# Patient Record
Sex: Male | Born: 1965 | Race: Black or African American | Hispanic: No | Marital: Single | Smoking: Never smoker
Health system: Southern US, Community
[De-identification: ages and names within clinical notes are randomized; demographics above are authoritative.]

## PROBLEM LIST (undated history)

## (undated) DIAGNOSIS — N400 Enlarged prostate without lower urinary tract symptoms: Secondary | ICD-10-CM

## (undated) DIAGNOSIS — G4730 Sleep apnea, unspecified: Secondary | ICD-10-CM

## (undated) DIAGNOSIS — I10 Essential (primary) hypertension: Secondary | ICD-10-CM

## (undated) DIAGNOSIS — E1169 Type 2 diabetes mellitus with other specified complication: Secondary | ICD-10-CM

## (undated) DIAGNOSIS — E119 Type 2 diabetes mellitus without complications: Secondary | ICD-10-CM

## (undated) HISTORY — PX: ROTATOR CUFF REPAIR: SHX139

## (undated) HISTORY — DX: Type 2 diabetes mellitus without complications: E11.9

## (undated) HISTORY — DX: Benign prostatic hyperplasia without lower urinary tract symptoms: N40.0

## (undated) HISTORY — DX: Sleep apnea, unspecified: G47.30

## (undated) HISTORY — PX: OTHER SURGICAL HISTORY: SHX169

## (undated) HISTORY — DX: Type 2 diabetes mellitus with other specified complication: E11.69

## (undated) HISTORY — PX: BACK SURGERY: SHX140

## (undated) HISTORY — DX: Essential (primary) hypertension: I10

---

## 2015-01-28 ENCOUNTER — Other Ambulatory Visit: Payer: Self-pay | Admitting: Otolaryngology

## 2015-01-28 DIAGNOSIS — E041 Nontoxic single thyroid nodule: Secondary | ICD-10-CM

## 2015-02-03 ENCOUNTER — Other Ambulatory Visit: Payer: Self-pay | Admitting: Otolaryngology

## 2015-02-03 DIAGNOSIS — E041 Nontoxic single thyroid nodule: Secondary | ICD-10-CM

## 2015-02-10 ENCOUNTER — Ambulatory Visit
Admission: RE | Admit: 2015-02-10 | Discharge: 2015-02-10 | Disposition: A | Discharge status: Home / Self Care | Payer: 59 | Source: Ambulatory Visit | Attending: Otolaryngology | Admitting: Otolaryngology

## 2015-02-10 DIAGNOSIS — E041 Nontoxic single thyroid nodule: Secondary | ICD-10-CM

## 2015-03-07 ENCOUNTER — Other Ambulatory Visit: Payer: Self-pay | Admitting: Otolaryngology

## 2015-03-07 DIAGNOSIS — E041 Nontoxic single thyroid nodule: Secondary | ICD-10-CM

## 2015-03-09 ENCOUNTER — Other Ambulatory Visit (HOSPITAL_COMMUNITY)
Admission: RE | Admit: 2015-03-09 | Discharge: 2015-03-09 | Disposition: A | Discharge status: Home / Self Care | Payer: 59 | Source: Ambulatory Visit | Attending: Interventional Radiology | Admitting: Interventional Radiology

## 2015-03-09 ENCOUNTER — Ambulatory Visit
Admission: RE | Admit: 2015-03-09 | Discharge: 2015-03-09 | Disposition: A | Discharge status: Home / Self Care | Payer: 59 | Source: Ambulatory Visit | Attending: Otolaryngology | Admitting: Otolaryngology

## 2015-03-09 DIAGNOSIS — E041 Nontoxic single thyroid nodule: Secondary | ICD-10-CM | POA: Insufficient documentation

## 2015-08-16 ENCOUNTER — Other Ambulatory Visit: Payer: Self-pay | Admitting: Otolaryngology

## 2015-12-07 ENCOUNTER — Other Ambulatory Visit: Payer: Self-pay | Admitting: Otolaryngology

## 2015-12-07 DIAGNOSIS — E041 Nontoxic single thyroid nodule: Secondary | ICD-10-CM

## 2015-12-13 ENCOUNTER — Other Ambulatory Visit: Payer: Self-pay | Admitting: Otolaryngology

## 2015-12-13 DIAGNOSIS — E041 Nontoxic single thyroid nodule: Secondary | ICD-10-CM

## 2016-04-20 ENCOUNTER — Ambulatory Visit
Admission: RE | Admit: 2016-04-20 | Discharge: 2016-04-20 | Disposition: A | Discharge status: Home / Self Care | Payer: 59 | Source: Ambulatory Visit | Attending: Otolaryngology | Admitting: Otolaryngology

## 2016-04-20 DIAGNOSIS — E041 Nontoxic single thyroid nodule: Secondary | ICD-10-CM

## 2016-09-17 DIAGNOSIS — E782 Mixed hyperlipidemia: Secondary | ICD-10-CM | POA: Diagnosis not present

## 2016-09-17 DIAGNOSIS — I10 Essential (primary) hypertension: Secondary | ICD-10-CM | POA: Diagnosis not present

## 2016-09-17 DIAGNOSIS — E1165 Type 2 diabetes mellitus with hyperglycemia: Secondary | ICD-10-CM | POA: Diagnosis not present

## 2016-09-19 DIAGNOSIS — I10 Essential (primary) hypertension: Secondary | ICD-10-CM | POA: Diagnosis not present

## 2016-09-19 DIAGNOSIS — E1165 Type 2 diabetes mellitus with hyperglycemia: Secondary | ICD-10-CM | POA: Diagnosis not present

## 2016-09-19 DIAGNOSIS — E782 Mixed hyperlipidemia: Secondary | ICD-10-CM | POA: Diagnosis not present

## 2017-01-08 DIAGNOSIS — Z0001 Encounter for general adult medical examination with abnormal findings: Secondary | ICD-10-CM | POA: Diagnosis not present

## 2017-01-10 DIAGNOSIS — Z0001 Encounter for general adult medical examination with abnormal findings: Secondary | ICD-10-CM | POA: Diagnosis not present

## 2017-03-19 DIAGNOSIS — M545 Low back pain: Secondary | ICD-10-CM | POA: Diagnosis not present

## 2017-03-19 DIAGNOSIS — I10 Essential (primary) hypertension: Secondary | ICD-10-CM | POA: Diagnosis not present

## 2017-03-19 DIAGNOSIS — E1165 Type 2 diabetes mellitus with hyperglycemia: Secondary | ICD-10-CM | POA: Diagnosis not present

## 2017-03-25 DIAGNOSIS — M545 Low back pain: Secondary | ICD-10-CM | POA: Diagnosis not present

## 2017-04-05 IMAGING — US US THYROID
1 series · 12 of 25 positions shown · non-contrast
Comparison: 03/09/2015 and previous

CLINICAL DATA: Nodules. Previous FNA biopsy of 1 calcified isthmic
and 2 left nodules 03/09/2015

EXAM:
THYROID ULTRASOUND
TECHNIQUE: Ultrasound examination of the thyroid gland and adjacent soft
tissues was performed.

[Series 1: us thyroid · 0.09mm/px · 12 of 62 slices shown]
[im 3/62]
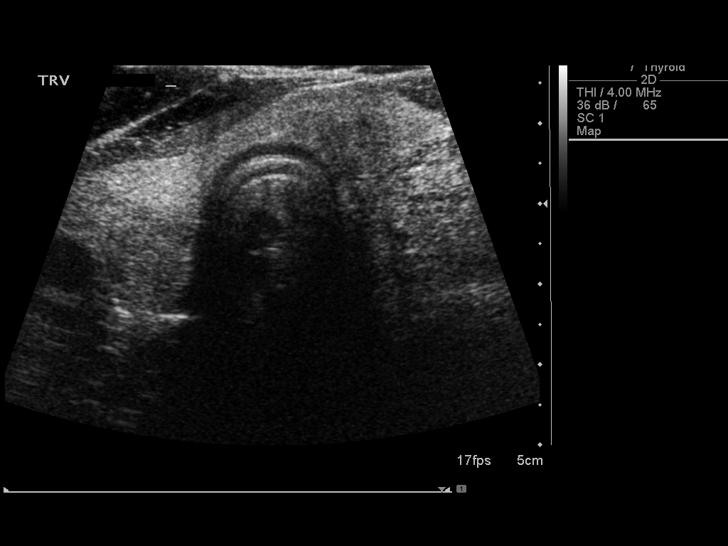
[im 8/62]
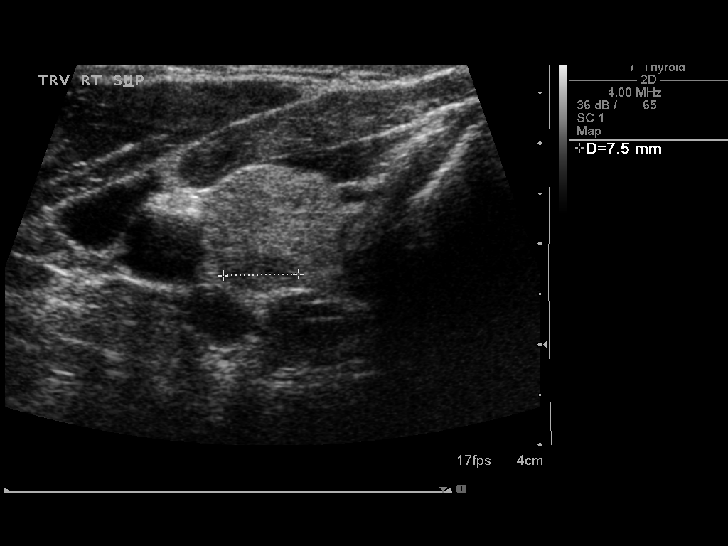
[im 13/62]
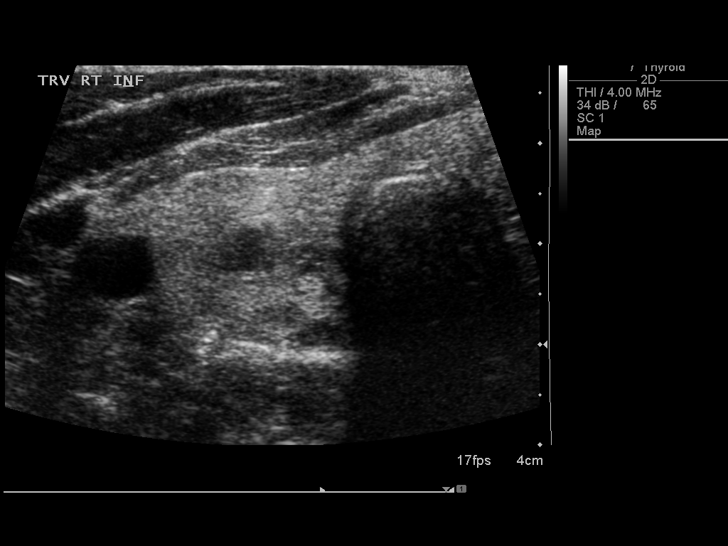
[im 18/62]
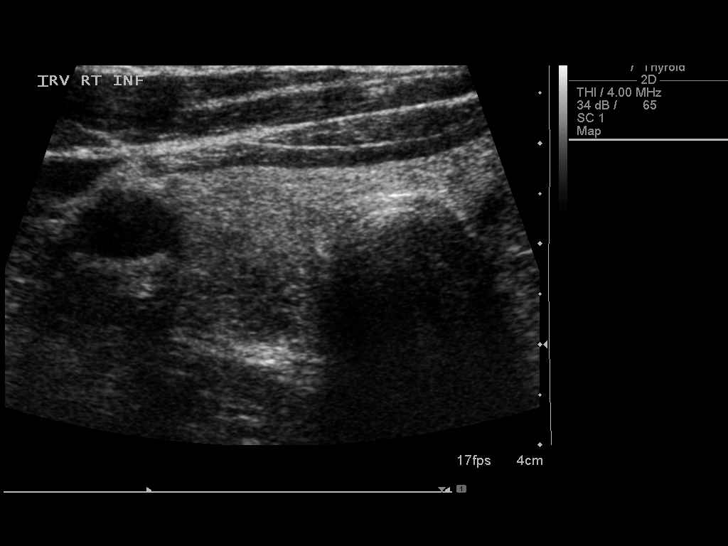
[im 23/62]
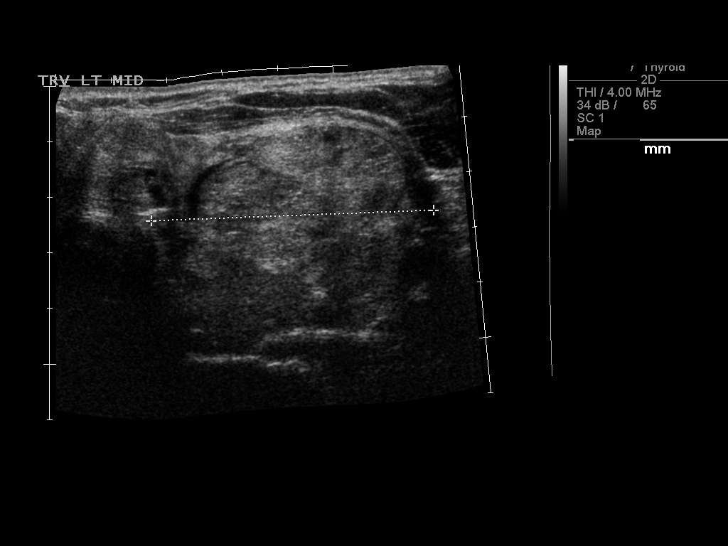
[im 28/62]
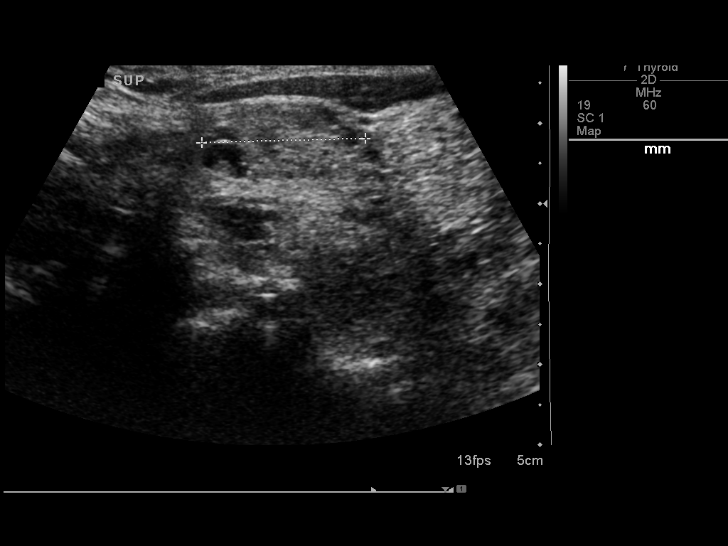
[im 34/62]
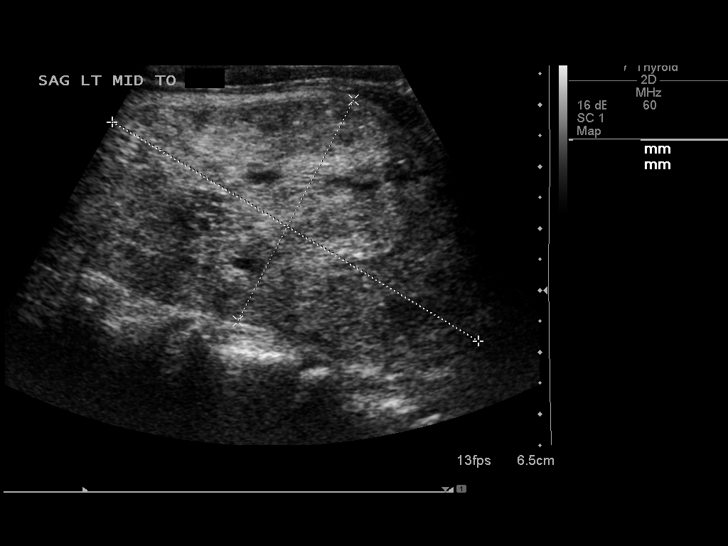
[im 39/62]
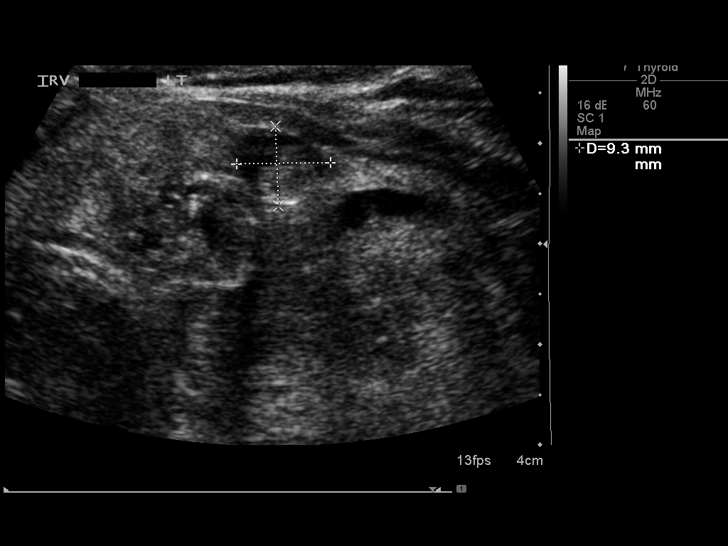
[im 44/62]
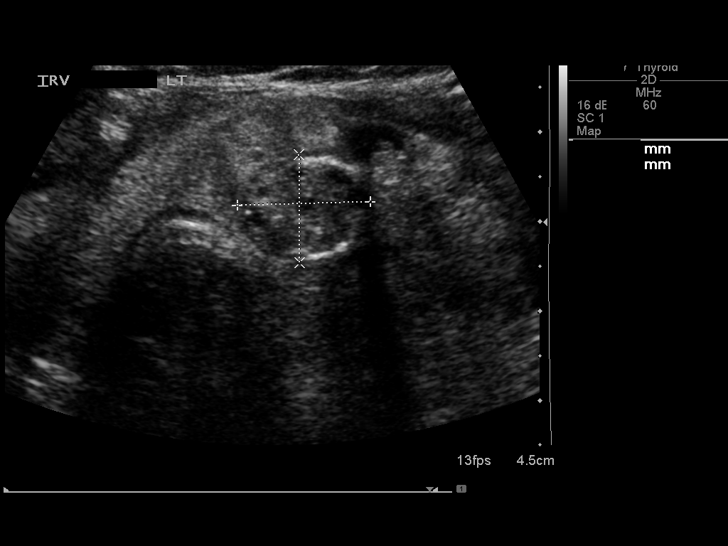
[im 49/62]
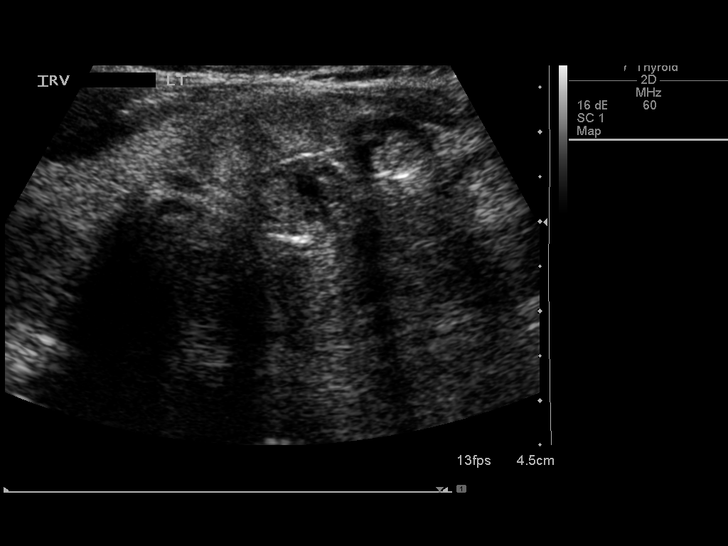
[im 54/62]
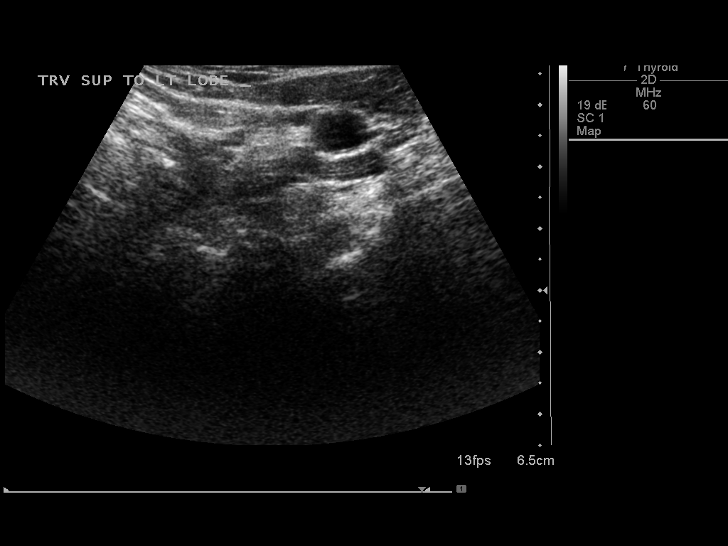
[im 59/62]
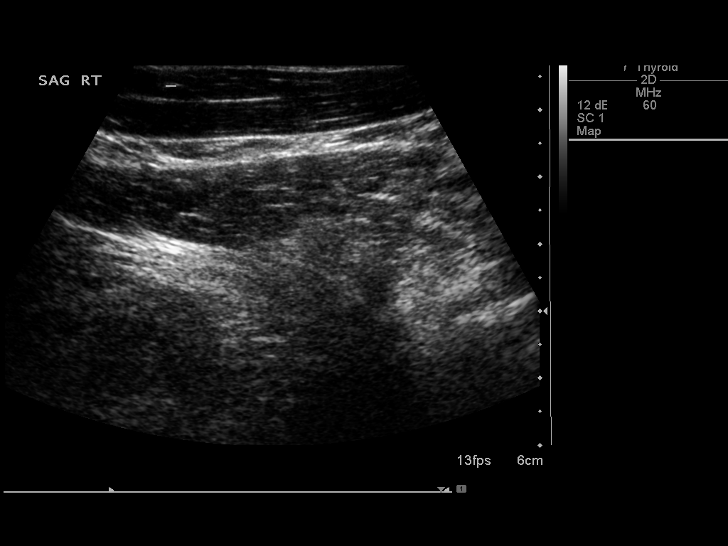

[12 of 25 positions shown; findings below may reference images not displayed]

FINDINGS: Parenchymal Echotexture: Mildly heterogenous

Estimated total number of nodules >/= 1 cm: 5

Number of spongiform nodules > 2 cm not described below (TR1): 0

Number of mixed cystic and solid nodules > 1.5 cm not described
below (TR2): 0

_________________________________________________________

Isthmus: 5 mm in thickness

Nodule # 1:

Location: Isthmus; left

Size: 1.1 x 0.8 x 0.9 cm (previously 9 mm)

Composition: mixed cystic and solid (1)

Echogenicity: isoechoic (1)

Shape: not taller-than-wide (0)

Margins: smooth (0)

Echogenic foci: peripheral calcifications (2)

ACR TI-RADS total points: 4.

ACR TI-RADS risk category: TR4 (4-6 points).

ACR TI-RADS recommendations:

*Given size (1 - 1.5 cm) and appearance, a follow-up ultrasound in 1
year should be considered based on TI-RADS criteria.

Nodule # 2:

Location: Isthmus; left inferior

Size: 1.7 x 1.2 x 1.5 cm (previously 1.4 x 1 x 1.3)

Composition: solid/almost completely solid (2)

Echogenicity: isoechoic (1)

Shape: not taller-than-wide (0)

Margins: smooth (0)

Echogenic foci: peripheral calcifications (2)

ACR TI-RADS total points: 5.

ACR TI-RADS risk category: TR4 (4-6 points).

ACR TI-RADS recommendations:

Previously biopsied. Assuming a benign diagnosis, repeat biopsy or
dedicated follow-up is NOT recommended based on TI-RADS criteria.

_________________________________________________________

Right lobe: 5.9 x 1.9 x 2 cm

Nodule # 1:

Location: Right; Inferior

Size: 1.8 x 1.3 x 1.7 cm (previously 1.3 x 0.8 x 1.1)

Composition: solid/almost completely solid (2)

Echogenicity: hypoechoic (2)

Shape: not taller-than-wide (0)

Margins: ill-defined (0)

Echogenic foci: none (0)

ACR TI-RADS total points: 4.

ACR TI-RADS risk category: TR4 (4-6 points).

ACR TI-RADS recommendations:

**Given size (>1.5 cm) and appearance, fine needle aspiration of
this moderately suspicious nodule should be considered based on
TI-RADS criteria.

_________________________________________________________

Left lobe: 8.8 x 4.8 x 5.1 cm

Nodule # 1:

Location: Left; Superior

Size: 2 x 1.3 x 1.4 cm (previously 1.9 x 1 x 1.4)

Composition: solid/almost completely solid (2)

Echogenicity: hyperechoic (1)

Shape: not taller-than-wide (0)

Margins: ill-defined (0)

Echogenic foci: none (0)

ACR TI-RADS total points: 3.

ACR TI-RADS risk category: TR3 (3 points).

ACR TI-RADS recommendations:

*Given size (1.5 - 2.4 cm) and appearance, a follow-up ultrasound in
1 year should be considered based on TI-RADS criteria.

Nodule # 2:

Location: Left; Inferior

Size: 6.9 x 3.7 x 4.4 cm (previously described as 2 separate
nodules, both regions biopsied)

Composition: solid/almost completely solid (2)

Echogenicity: hyperechoic (1)

Shape: not taller-than-wide (0)

Margins: ill-defined (0)

Echogenic foci: none (0)

ACR TI-RADS total points: 3.

ACR TI-RADS risk category: TR3 (3 points).

ACR TI-RADS recommendations:

Previously biopsied. Assuming a benign diagnosis, repeat biopsy or
dedicated follow-up is NOT recommended based on TI-RADS criteria.
IMPRESSION: 1. Thyromegaly with multiple nodules.
2. Moderately suspicious 18 mm right inferior nodule, FNA biopsy
recommended.
3. One year follow-up scan recommended of isthmic and superior left
nodules, as above.

The above is in keeping with the ACR TI-RADS recommendations - [HOSPITAL] 2940;[DATE].

## 2017-04-15 DIAGNOSIS — I10 Essential (primary) hypertension: Secondary | ICD-10-CM | POA: Diagnosis not present

## 2017-04-15 DIAGNOSIS — E1165 Type 2 diabetes mellitus with hyperglycemia: Secondary | ICD-10-CM | POA: Diagnosis not present

## 2017-04-15 DIAGNOSIS — D126 Benign neoplasm of colon, unspecified: Secondary | ICD-10-CM | POA: Diagnosis not present

## 2017-04-17 DIAGNOSIS — E1165 Type 2 diabetes mellitus with hyperglycemia: Secondary | ICD-10-CM | POA: Diagnosis not present

## 2017-04-17 DIAGNOSIS — E782 Mixed hyperlipidemia: Secondary | ICD-10-CM | POA: Diagnosis not present

## 2017-04-17 DIAGNOSIS — Z1389 Encounter for screening for other disorder: Secondary | ICD-10-CM | POA: Diagnosis not present

## 2017-04-17 DIAGNOSIS — I10 Essential (primary) hypertension: Secondary | ICD-10-CM | POA: Diagnosis not present

## 2017-06-04 DIAGNOSIS — E042 Nontoxic multinodular goiter: Secondary | ICD-10-CM | POA: Diagnosis not present

## 2017-06-12 DIAGNOSIS — Z23 Encounter for immunization: Secondary | ICD-10-CM | POA: Diagnosis not present

## 2017-06-20 ENCOUNTER — Other Ambulatory Visit: Payer: Self-pay | Admitting: Otolaryngology

## 2017-06-20 DIAGNOSIS — E042 Nontoxic multinodular goiter: Secondary | ICD-10-CM

## 2017-07-01 ENCOUNTER — Ambulatory Visit
Admission: RE | Admit: 2017-07-01 | Discharge: 2017-07-01 | Disposition: A | Discharge status: Home / Self Care | Payer: 59 | Source: Ambulatory Visit | Attending: Otolaryngology | Admitting: Otolaryngology

## 2017-07-01 DIAGNOSIS — E042 Nontoxic multinodular goiter: Secondary | ICD-10-CM | POA: Diagnosis not present

## 2017-07-09 DIAGNOSIS — I10 Essential (primary) hypertension: Secondary | ICD-10-CM | POA: Diagnosis not present

## 2017-07-09 DIAGNOSIS — E782 Mixed hyperlipidemia: Secondary | ICD-10-CM | POA: Diagnosis not present

## 2017-07-09 DIAGNOSIS — E1165 Type 2 diabetes mellitus with hyperglycemia: Secondary | ICD-10-CM | POA: Diagnosis not present

## 2017-07-17 DIAGNOSIS — E1165 Type 2 diabetes mellitus with hyperglycemia: Secondary | ICD-10-CM | POA: Diagnosis not present

## 2017-07-17 DIAGNOSIS — I10 Essential (primary) hypertension: Secondary | ICD-10-CM | POA: Diagnosis not present

## 2017-07-17 DIAGNOSIS — E782 Mixed hyperlipidemia: Secondary | ICD-10-CM | POA: Diagnosis not present

## 2017-08-09 DIAGNOSIS — G4733 Obstructive sleep apnea (adult) (pediatric): Secondary | ICD-10-CM | POA: Diagnosis not present

## 2017-08-31 DIAGNOSIS — Z719 Counseling, unspecified: Secondary | ICD-10-CM | POA: Diagnosis not present

## 2017-09-07 DIAGNOSIS — Z23 Encounter for immunization: Secondary | ICD-10-CM | POA: Diagnosis not present

## 2017-09-13 DIAGNOSIS — Z719 Counseling, unspecified: Secondary | ICD-10-CM | POA: Diagnosis not present

## 2017-09-20 DIAGNOSIS — Z719 Counseling, unspecified: Secondary | ICD-10-CM | POA: Diagnosis not present

## 2017-09-27 DIAGNOSIS — Z719 Counseling, unspecified: Secondary | ICD-10-CM | POA: Diagnosis not present

## 2017-10-04 DIAGNOSIS — Z719 Counseling, unspecified: Secondary | ICD-10-CM | POA: Diagnosis not present

## 2017-10-11 DIAGNOSIS — Z719 Counseling, unspecified: Secondary | ICD-10-CM | POA: Diagnosis not present

## 2017-10-18 DIAGNOSIS — Z719 Counseling, unspecified: Secondary | ICD-10-CM | POA: Diagnosis not present

## 2017-10-25 DIAGNOSIS — Z719 Counseling, unspecified: Secondary | ICD-10-CM | POA: Diagnosis not present

## 2017-11-01 DIAGNOSIS — Z719 Counseling, unspecified: Secondary | ICD-10-CM | POA: Diagnosis not present

## 2017-11-08 DIAGNOSIS — Z719 Counseling, unspecified: Secondary | ICD-10-CM | POA: Diagnosis not present

## 2017-11-15 DIAGNOSIS — Z719 Counseling, unspecified: Secondary | ICD-10-CM | POA: Diagnosis not present

## 2017-11-22 DIAGNOSIS — Z719 Counseling, unspecified: Secondary | ICD-10-CM | POA: Diagnosis not present

## 2017-11-26 DIAGNOSIS — Z0000 Encounter for general adult medical examination without abnormal findings: Secondary | ICD-10-CM | POA: Diagnosis not present

## 2017-11-29 DIAGNOSIS — Z719 Counseling, unspecified: Secondary | ICD-10-CM | POA: Diagnosis not present

## 2017-12-20 DIAGNOSIS — Z23 Encounter for immunization: Secondary | ICD-10-CM | POA: Diagnosis not present

## 2018-01-17 DIAGNOSIS — Z0001 Encounter for general adult medical examination with abnormal findings: Secondary | ICD-10-CM | POA: Diagnosis not present

## 2018-01-17 DIAGNOSIS — E782 Mixed hyperlipidemia: Secondary | ICD-10-CM | POA: Diagnosis not present

## 2018-01-17 DIAGNOSIS — I10 Essential (primary) hypertension: Secondary | ICD-10-CM | POA: Diagnosis not present

## 2018-01-17 DIAGNOSIS — E1165 Type 2 diabetes mellitus with hyperglycemia: Secondary | ICD-10-CM | POA: Diagnosis not present

## 2018-01-17 DIAGNOSIS — K219 Gastro-esophageal reflux disease without esophagitis: Secondary | ICD-10-CM | POA: Diagnosis not present

## 2018-01-20 DIAGNOSIS — E782 Mixed hyperlipidemia: Secondary | ICD-10-CM | POA: Diagnosis not present

## 2018-01-20 DIAGNOSIS — E119 Type 2 diabetes mellitus without complications: Secondary | ICD-10-CM | POA: Diagnosis not present

## 2018-01-20 DIAGNOSIS — I10 Essential (primary) hypertension: Secondary | ICD-10-CM | POA: Diagnosis not present

## 2018-01-24 DIAGNOSIS — R001 Bradycardia, unspecified: Secondary | ICD-10-CM | POA: Diagnosis not present

## 2018-02-06 ENCOUNTER — Ambulatory Visit (INDEPENDENT_AMBULATORY_CARE_PROVIDER_SITE_OTHER): Payer: 59 | Admitting: Cardiovascular Disease

## 2018-02-06 ENCOUNTER — Encounter: Payer: Self-pay | Admitting: Cardiovascular Disease

## 2018-02-06 VITALS — BP 98/60 | HR 65 | Ht 71.0 in | Wt 175.0 lb

## 2018-02-06 DIAGNOSIS — G4730 Sleep apnea, unspecified: Secondary | ICD-10-CM | POA: Diagnosis not present

## 2018-02-06 DIAGNOSIS — I10 Essential (primary) hypertension: Secondary | ICD-10-CM | POA: Diagnosis not present

## 2018-02-06 DIAGNOSIS — R001 Bradycardia, unspecified: Secondary | ICD-10-CM

## 2018-02-06 NOTE — Patient Instructions (Signed)
Your physician recommends that you schedule a follow-up appointment in: 3 months Dr Bronson Ing    Your physician has recommended that you wear an event monitor. Event monitors are medical devices that record the heart's electrical activity. Doctors most often [place] these monitors to diagnose arrhythmias. Arrhythmias are problems with the speed or rhythm of the heartbeat. The monitor is a small, portable device. You can wear one while you do your normal daily activities. This is usually used to diagnose what is causing palpitations/syncope (passing out).    Your physician recommends that you continue on your current medications as directed. Please refer to the Current Medication list given to you today.    If you need a refill on your cardiac medications before your next appointment, please call your pharmacy.     No lab work ordered today     Thank you for choosing [facility] !

## 2018-02-06 NOTE — Progress Notes (Signed)
CARDIOLOGY CONSULT NOTE  Patient ID: Kenneth Johnson MRN: [id number] DOB/AGE: [birth year] 52 y.o.  Admit date: (Not on file) Primary Physician: Curlene Labrum, MD Referring Physician: Curlene Labrum, MD  Reason for Consultation: Bradycardia  HPI: Kenneth Johnson is a 52 y.o. male who is being seen today for the evaluation of bradycardia at the request of Burdine, Virgina Evener, MD.   Past medical history includes hypertension, hyperlipidemia, and type 2 diabetes mellitus.  I reviewed labs dated 01/17/2018: BUN 15, creatinine 1.06, sodium 142, potassium 4.2, hemoglobin A1c 5.8%, total cholesterol 116, triglycerides 36, HDL 62, LDL 47, TSH 1.35.  I personally reviewed an outside ECG which demonstrated sinus bradycardia, 45 bpm, with nonspecific T wave abnormalities.  He is entirely asymptomatic and denies chest pain, shortness of breath, palpitations, leg swelling, lightheadedness, dizziness, and syncope.  He has sleep apnea but has not been using CPAP for the past month.  He recently lost 40 pounds through changing dietary habits and exercising.  His girlfriend is a physician and was concerned about his low heart rate which made him concerned.  Energy levels are stable.  With regard to exercise, he does some cardio including walking and jumping jacks.  He wears a FitBit and his heart rate averages in the low 50 bpm range to low 60 bpm range.  Family history: His father is 23 years old and has a slow heart rate and is being monitored for this.  He does not have a pacemaker.   No Known Allergies  Current Outpatient Medications  Medication Sig Dispense Refill  . acyclovir (ZOVIRAX) 400 MG tablet Take 400 mg by mouth daily.    Marland Kitchen lisinopril (PRINIVIL,ZESTRIL) 10 MG tablet Take 10 mg by mouth daily.    . metFORMIN (GLUCOPHAGE) 1000 MG tablet Take 1,000 mg by mouth 2 (two) times daily with a meal.    . rosuvastatin (CRESTOR) 5 MG tablet Take 5 mg by mouth daily.     No  current facility-administered medications for this visit.     Past Medical History:  Diagnosis Date  . DM (diabetes mellitus) ([place])     Past Surgical History:  Procedure Laterality Date  . gum implants      Social History   Socioeconomic History  . Marital status: Single    Spouse name: Not on file  . Number of children: Not on file  . Years of education: Not on file  . Highest education level: Not on file  Occupational History  . Not on file  Social Needs  . Financial resource strain: Not on file  . Food insecurity:    Worry: Not on file    Inability: Not on file  . Transportation needs:    Medical: Not on file    Non-medical: Not on file  Tobacco Use  . Smoking status: Never Smoker  . Smokeless tobacco: Never Used  Substance and Sexual Activity  . Alcohol use: Never    Frequency: Never  . Drug use: Never  . Sexual activity: Not on file  Lifestyle  . Physical activity:    Days per week: Not on file    Minutes per session: Not on file  . Stress: Not on file  Relationships  . Social connections:    Talks on phone: Not on file    Gets together: Not on file    Attends religious service: Not on file    Active member of club or organization: Not on file  Attends meetings of clubs or organizations: Not on file    Relationship status: Not on file  . Intimate partner violence:    Fear of current or ex partner: Not on file    Emotionally abused: Not on file    Physically abused: Not on file    Forced sexual activity: Not on file  Other Topics Concern  . Not on file  Social History Narrative  . Not on file     Current Meds  Medication Sig  . acyclovir (ZOVIRAX) 400 MG tablet Take 400 mg by mouth daily.  Marland Kitchen lisinopril (PRINIVIL,ZESTRIL) 10 MG tablet Take 10 mg by mouth daily.  . metFORMIN (GLUCOPHAGE) 1000 MG tablet Take 1,000 mg by mouth 2 (two) times daily with a meal.  . rosuvastatin (CRESTOR) 5 MG tablet Take 5 mg by mouth daily.      Review of  systems complete and found to be negative unless listed above in HPI    Physical exam Blood pressure 98/60, pulse 65, height 5\' 11"  (1.803 m), weight 175 lb (79.4 kg), SpO2 98 %. General: NAD Neck: No JVD, no thyromegaly or thyroid nodule.  Lungs: Clear to auscultation bilaterally with normal respiratory effort. CV: Nondisplaced PMI. Regular rate and rhythm, normal S1/S2, no S3/S4, no murmur.  No peripheral edema.  No carotid bruit.    Abdomen: Soft, nontender, no distention.  Skin: Intact without lesions or rashes.  Neurologic: Alert and oriented x 3.  Psych: Normal affect. Extremities: No clubbing or cyanosis.  HEENT: Normal.   ECG: Most recent ECG reviewed.   Labs: No results found for: K, BUN, CREATININE, ALT, TSH, HGB   Lipids: No results found for: LDLCALC, LDLDIRECT, CHOL, TRIG, HDL      ASSESSMENT AND PLAN:  1.  Bradycardia: TSH is normal.  He has sleep apnea but has not been using CPAP for the past month which can account for bradycardia.  He has also lost 40 pounds and become more fit which would physiologically reduce his heart rate.  He is entirely asymptomatic at this time.  I will obtain a 1 week event monitor to assess for significant bradycardia.  I suspect I will simply continue monitoring him for symptoms.  2.  Sleep apnea: He has not been using CPAP for the past month and I highly encouraged CPAP compliance and explained to him that untreated sleep apnea is a risk factor for arrhythmias, CVA, and MI.  3.  Hypertension: Controlled on present therapy.  No changes.   Disposition: Follow up in 3 months   Signed: Kate Sable, M.D., F.A.C.C.  02/06/2018, 2:13 PM

## 2018-02-10 ENCOUNTER — Ambulatory Visit (INDEPENDENT_AMBULATORY_CARE_PROVIDER_SITE_OTHER): Payer: 59

## 2018-02-10 DIAGNOSIS — R001 Bradycardia, unspecified: Secondary | ICD-10-CM | POA: Diagnosis not present

## 2018-02-10 DIAGNOSIS — M25511 Pain in right shoulder: Secondary | ICD-10-CM | POA: Diagnosis not present

## 2018-02-10 DIAGNOSIS — Z6824 Body mass index (BMI) 24.0-24.9, adult: Secondary | ICD-10-CM | POA: Diagnosis not present

## 2018-03-20 ENCOUNTER — Other Ambulatory Visit: Payer: Self-pay | Admitting: Otolaryngology

## 2018-03-20 DIAGNOSIS — E042 Nontoxic multinodular goiter: Secondary | ICD-10-CM

## 2018-03-26 ENCOUNTER — Ambulatory Visit
Admission: RE | Admit: 2018-03-26 | Discharge: 2018-03-26 | Disposition: A | Discharge status: Home / Self Care | Payer: 59 | Source: Ambulatory Visit | Attending: Otolaryngology | Admitting: Otolaryngology

## 2018-03-26 DIAGNOSIS — E042 Nontoxic multinodular goiter: Secondary | ICD-10-CM

## 2018-03-26 DIAGNOSIS — E010 Iodine-deficiency related diffuse (endemic) goiter: Secondary | ICD-10-CM | POA: Diagnosis not present

## 2018-04-08 DIAGNOSIS — E042 Nontoxic multinodular goiter: Secondary | ICD-10-CM | POA: Diagnosis not present

## 2018-04-22 ENCOUNTER — Encounter: Payer: Self-pay | Admitting: Cardiovascular Disease

## 2018-05-20 ENCOUNTER — Ambulatory Visit: Payer: 59 | Admitting: Cardiovascular Disease

## 2018-05-28 DIAGNOSIS — Z23 Encounter for immunization: Secondary | ICD-10-CM | POA: Diagnosis not present

## 2018-07-14 DIAGNOSIS — E1165 Type 2 diabetes mellitus with hyperglycemia: Secondary | ICD-10-CM | POA: Diagnosis not present

## 2018-07-14 DIAGNOSIS — E782 Mixed hyperlipidemia: Secondary | ICD-10-CM | POA: Diagnosis not present

## 2018-07-14 DIAGNOSIS — M545 Low back pain: Secondary | ICD-10-CM | POA: Diagnosis not present

## 2018-07-14 DIAGNOSIS — I10 Essential (primary) hypertension: Secondary | ICD-10-CM | POA: Diagnosis not present

## 2018-07-22 DIAGNOSIS — E119 Type 2 diabetes mellitus without complications: Secondary | ICD-10-CM | POA: Diagnosis not present

## 2018-07-22 DIAGNOSIS — Z1389 Encounter for screening for other disorder: Secondary | ICD-10-CM | POA: Diagnosis not present

## 2018-07-22 DIAGNOSIS — I10 Essential (primary) hypertension: Secondary | ICD-10-CM | POA: Diagnosis not present

## 2018-11-07 DIAGNOSIS — E1165 Type 2 diabetes mellitus with hyperglycemia: Secondary | ICD-10-CM | POA: Diagnosis not present

## 2018-11-07 DIAGNOSIS — Z6826 Body mass index (BMI) 26.0-26.9, adult: Secondary | ICD-10-CM | POA: Diagnosis not present

## 2018-11-07 DIAGNOSIS — R6889 Other general symptoms and signs: Secondary | ICD-10-CM | POA: Diagnosis not present

## 2019-01-07 IMAGING — US US THYROID
1 series · 12 of 25 positions shown · non-contrast
Comparison: 02/10/2015, 03/09/2015, 04/20/2016

CLINICAL DATA: Goiter.  Multiple thyroid nodule

EXAM:
THYROID ULTRASOUND
TECHNIQUE: Ultrasound examination of the thyroid gland and adjacent soft
tissues was performed.

[Series 1: us thyroid · 0.04mm/px · 12 of 64 slices shown]
[im 3/64]
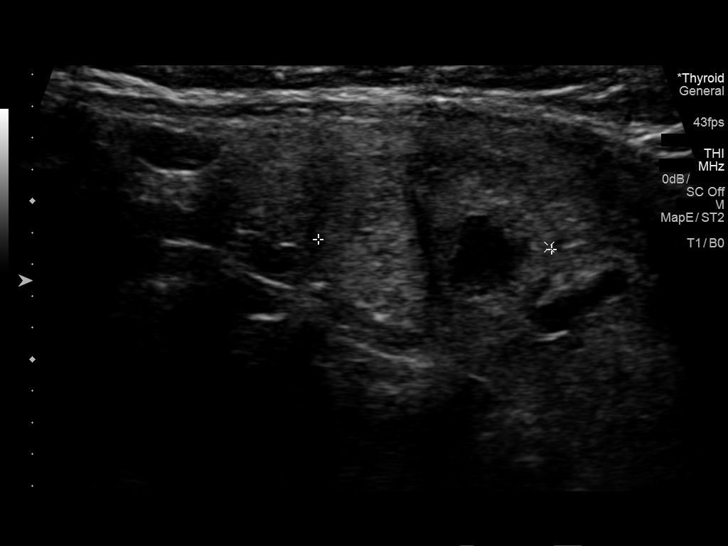
[im 8/64]
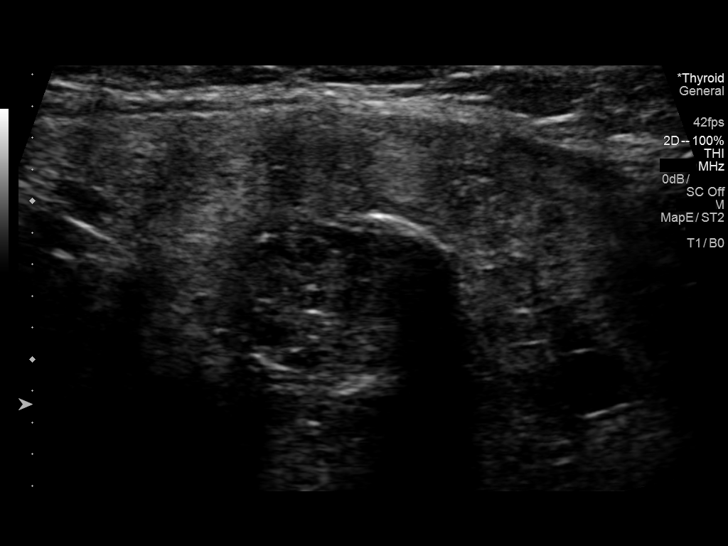
[im 14/64]
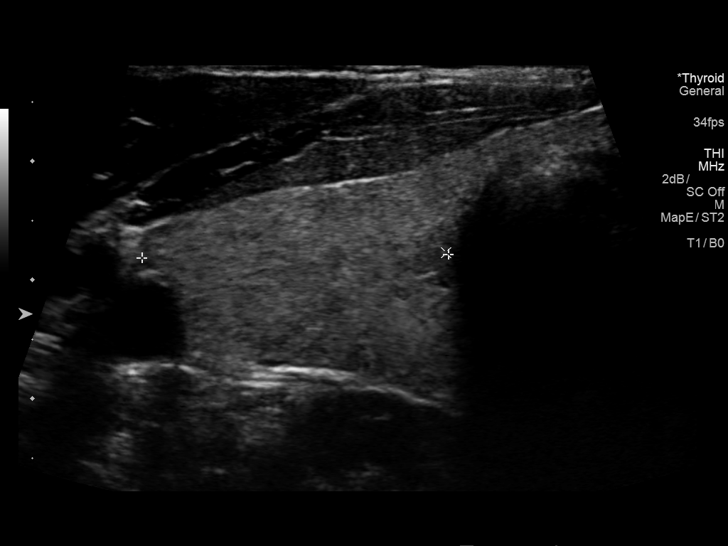
[im 19/64]
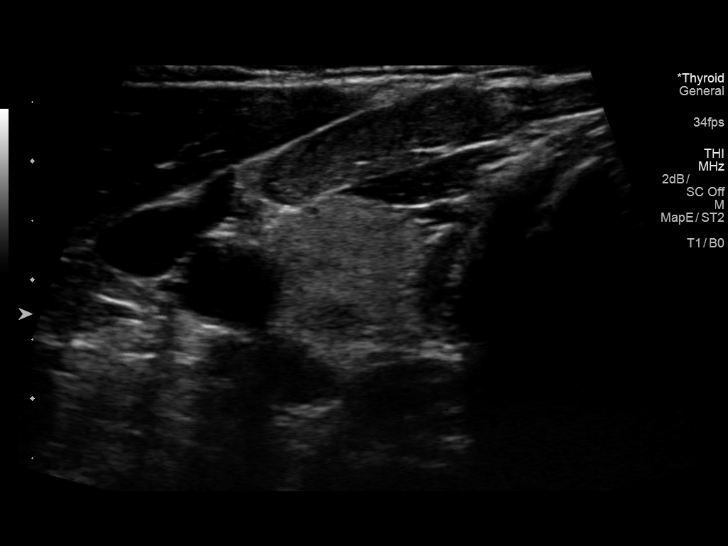
[im 24/64]
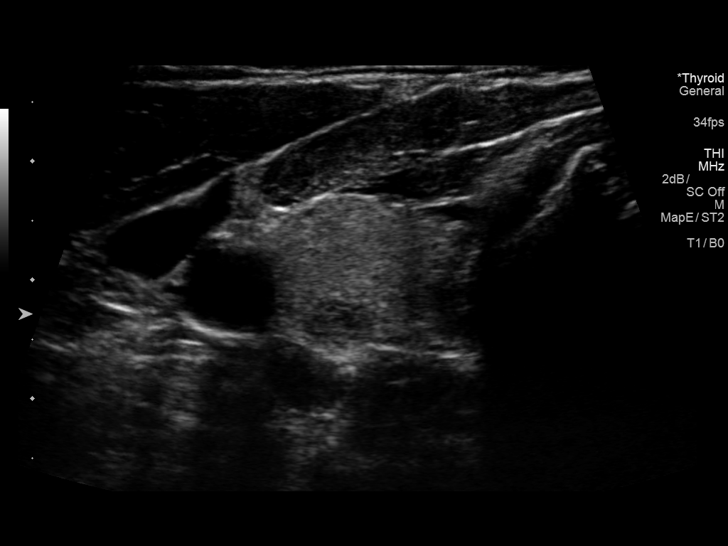
[im 29/64]
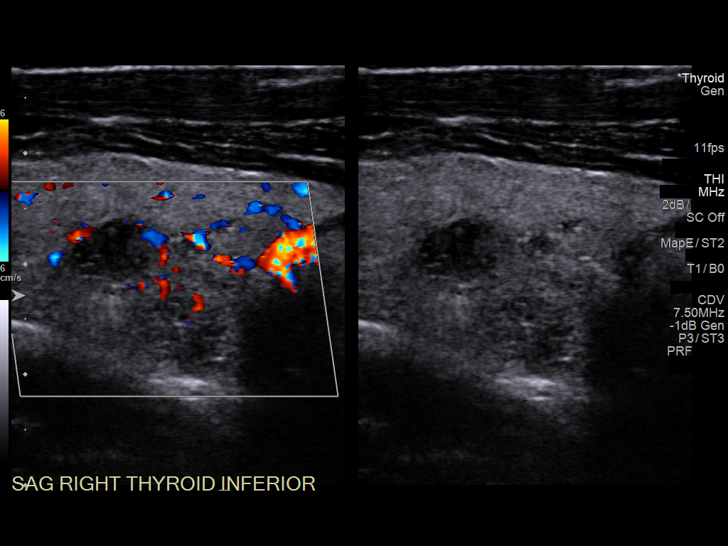
[im 35/64]
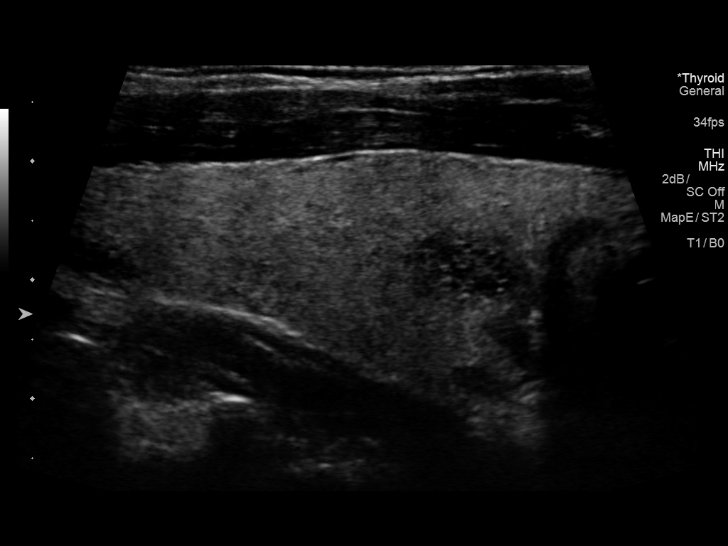
[im 40/64]
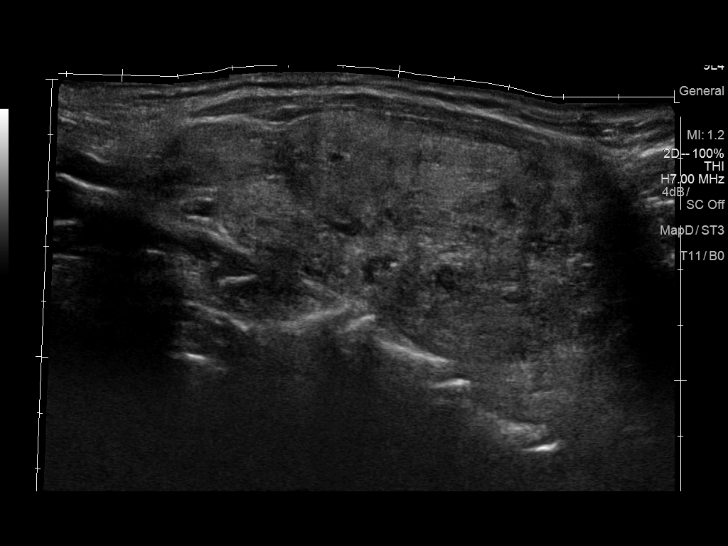
[im 45/64]
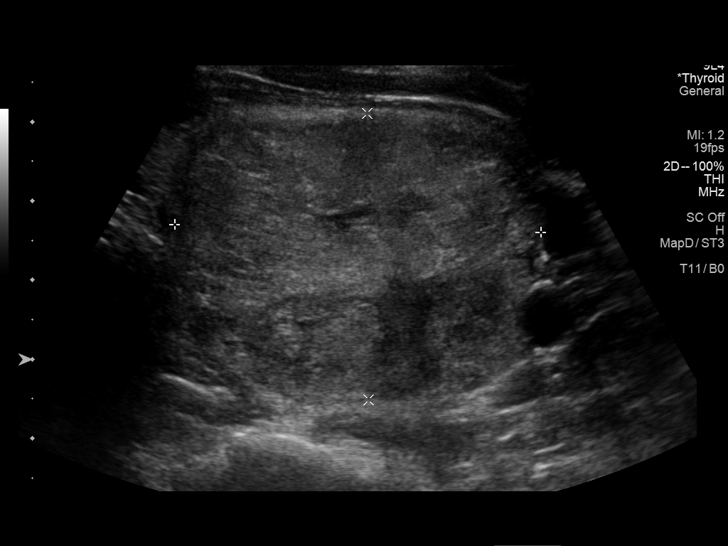
[im 50/64]
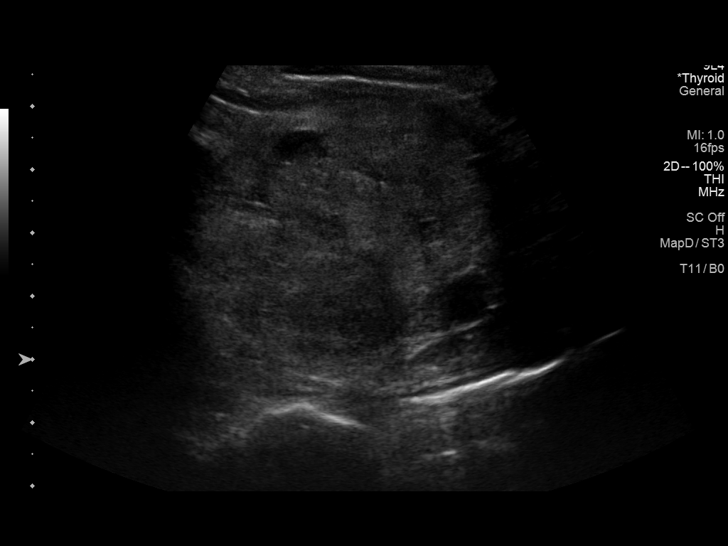
[im 56/64]
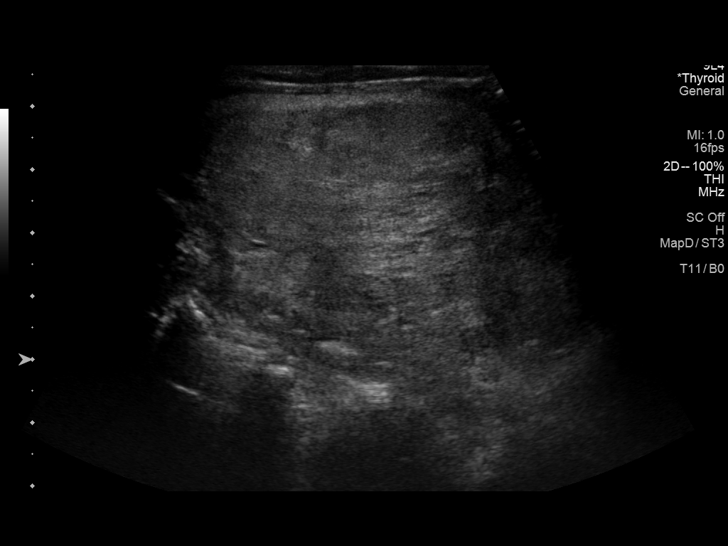
[im 61/64]
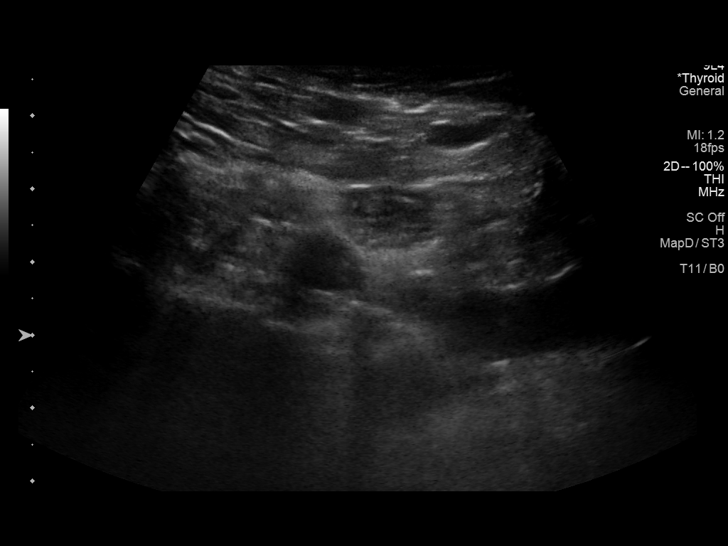

[12 of 25 positions shown; findings below may reference images not displayed]

FINDINGS: Parenchymal Echotexture: Moderately heterogenous

Isthmus: 5 mm, [place]

Right lobe: 6.1 x 1.8 x 2.6 cm, previously 5.9 x 1.9 x 2.0 cm

Left lobe: 10.0 x 4.2 x 5.0 cm, previously 8.8 x 4.8 x 5.1 cm

_________________________________________________________

Estimated total number of nodules >/= 1 cm: 4

Number of spongiform nodules >/=  2 cm not described below (TR1): 0

Number of mixed cystic and solid nodules >/= 1.5 cm not described
below (TR2): 0

_________________________________________________________

Nodule # 1:

Location: Isthmus; Mid

Maximum size: 1.6, previously 1.1 cm; Other 2 dimensions: 1.5 x 1.3,
previously 0.9 x 0.8 cm

Composition: mixed cystic and solid (1)

Echogenicity: isoechoic (1)

Shape: not taller-than-wide (0)

Margins: ill-defined (0)

Echogenic foci: none (0)

ACR TI-RADS total points: 2.

ACR TI-RADS risk category: TR2 (2 points).

ACR TI-RADS recommendations:

This nodule does NOT meet TI-RADS criteria for biopsy or dedicated
follow-up.

_________________________________________________________

Nodule # 2:

Prior biopsy: Yes

Location: Isthmus; Mid

Maximum size: 1.5 cm; Other 2 dimensions: 1.4 x 1.1 cm, previously,
1.5 x 1.3 cm

Composition: solid/almost completely solid (2)

Echogenicity: hypoechoic (2)

Shape: not taller-than-wide (0)

Margins: ill-defined (0)

Echogenic foci: peripheral calcifications (2)

ACR TI-RADS total points: 6.

ACR TI-RADS risk category:  TR4 (4-6 points).

Significant change in size (>/= 20% in two dimensions and minimal
increase of 2 mm): No

Change in features: No

Change in ACR TI-RADS risk category: No

ACR TI-RADS recommendations:

**Given size (>/= 1.5 cm) and appearance, fine needle aspiration of
this moderately suspicious nodule should be considered based on
TI-RADS criteria. Biopsy this nodule has already been performed.
Correlate with pathology.

_________________________________________________________

Nodule # 4:

Location: Right; Inferior

Maximum size: 2.1, previously 1.8 cm; Other 2 dimensions: 1.4 x 1.3,
previously 1.7 x 1.3 cm

Composition: solid/almost completely solid (2)

Echogenicity: hypoechoic (2)

Shape: not taller-than-wide (0)

Margins: ill-defined (0)

Echogenic foci: none (0)

ACR TI-RADS total points: 4.

ACR TI-RADS risk category: TR4 (4-6 points).

ACR TI-RADS recommendations:

**Given size (>/= 1.5 cm) and appearance, fine needle aspiration of
this moderately suspicious nodule should be considered based on
TI-RADS criteria.

_________________________________________________________

Nodule # 5:

Location: Left; Superior

Maximum size: 1.8, previously 2.0 cm; Other 2 dimensions: 1.6 x 1.6,
previously 1.4 x 1.3 cm

Composition: solid/almost completely solid (2)

Echogenicity: isoechoic (1)

Shape: not taller-than-wide (0)

Margins: ill-defined (0)

Echogenic foci: none (0)

ACR TI-RADS total points: 3.

ACR TI-RADS risk category: TR3 (3 points).

ACR TI-RADS recommendations:

*Given size (>/= 1.5 - 2.4 cm) and appearance, a follow-up
ultrasound in 1 year should be considered based on TI-RADS criteria.

_________________________________________________________

Nodule # 6:

Prior biopsy: Yes

Location: Left; Inferior

Maximum size: 7.2 cm; Other 2 dimensions: 4.6 x 3.6 cm, previously,
4.4 x 3.7 cm

Composition: solid/almost completely solid (2)

Echogenicity: isoechoic (1)

Shape: not taller-than-wide (0)

Margins: ill-defined (0)

Echogenic foci: none (0)

ACR TI-RADS total points: 3.

ACR TI-RADS risk category:  TR3 (3 points).

Significant change in size (>/= 20% in two dimensions and minimal
increase of 2 mm): No

Change in features: No

Change in ACR TI-RADS risk category: No

ACR TI-RADS recommendations:

**Given size (>/= 2.5 cm) and appearance, fine needle aspiration of
this mildly suspicious nodule should be considered based on TI-RADS
criteria. This nodule has already been biopsied correlate with prior
pathology

_________________________________________________________

No adenopathy
IMPRESSION: Grossly stable thyromegaly with multiple nodules.

2.1 cm right inferior TR 4 nodule meets criteria for biopsy as
above.

The above is in keeping with the ACR TI-RADS recommendations - [HOSPITAL] 3993;[DATE].

## 2019-03-07 ENCOUNTER — Other Ambulatory Visit: Payer: Self-pay | Admitting: Otolaryngology

## 2019-03-07 DIAGNOSIS — E042 Nontoxic multinodular goiter: Secondary | ICD-10-CM

## 2019-03-13 ENCOUNTER — Ambulatory Visit
Admission: RE | Admit: 2019-03-13 | Discharge: 2019-03-13 | Disposition: A | Discharge status: Home / Self Care | Payer: 59 | Source: Ambulatory Visit | Attending: Otolaryngology | Admitting: Otolaryngology

## 2019-03-13 DIAGNOSIS — E042 Nontoxic multinodular goiter: Secondary | ICD-10-CM

## 2019-09-12 ENCOUNTER — Ambulatory Visit: Payer: 59 | Attending: Internal Medicine

## 2019-09-12 DIAGNOSIS — Z23 Encounter for immunization: Secondary | ICD-10-CM

## 2019-10-02 IMAGING — US US THYROID
1 series · 13 of 25 positions shown · non-contrast
Comparison: None.

07/01/2017 and previous back to 02/10/2015

CLINICAL DATA: Multinodular goiter. Previous FNA biopsy of anterior
left, posterior left, and calcified isthmic nodules 03/09/2015

EXAM:
THYROID ULTRASOUND
TECHNIQUE: Ultrasound examination of the thyroid gland and adjacent soft
tissues was performed.

[Series 1: us thyroid · 0.04mm/px · 13 of 89 slices shown]
[im 1/89]
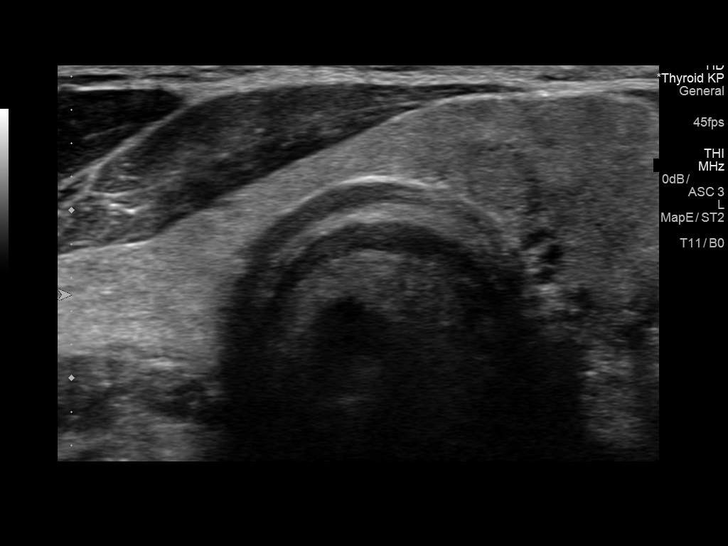
[im 8/89]
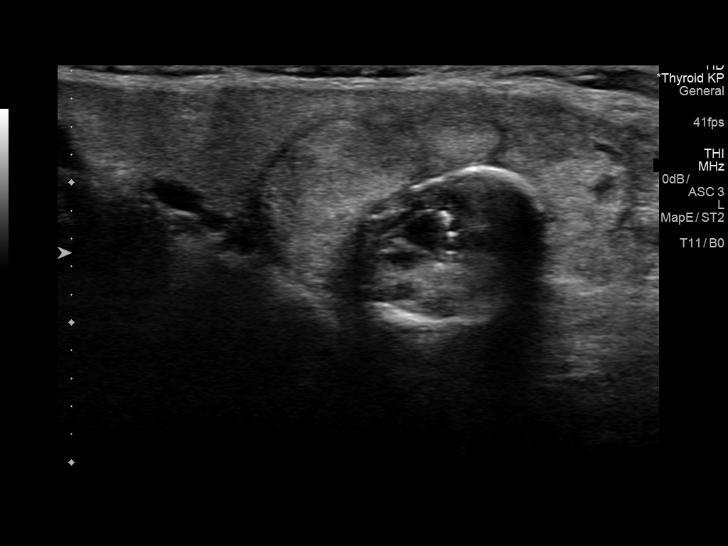
[im 15/89]
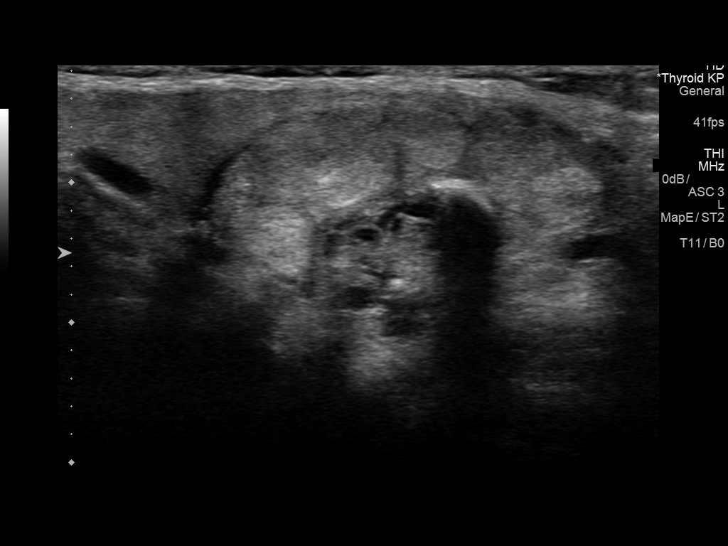
[im 23/89]
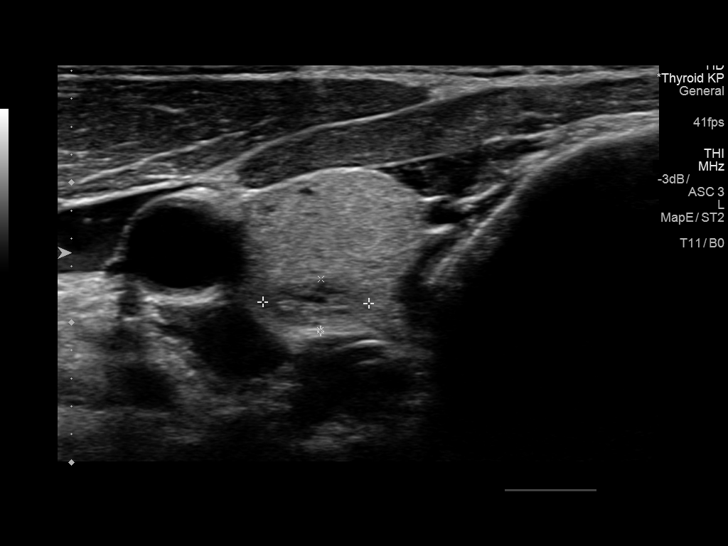
[im 30/89]
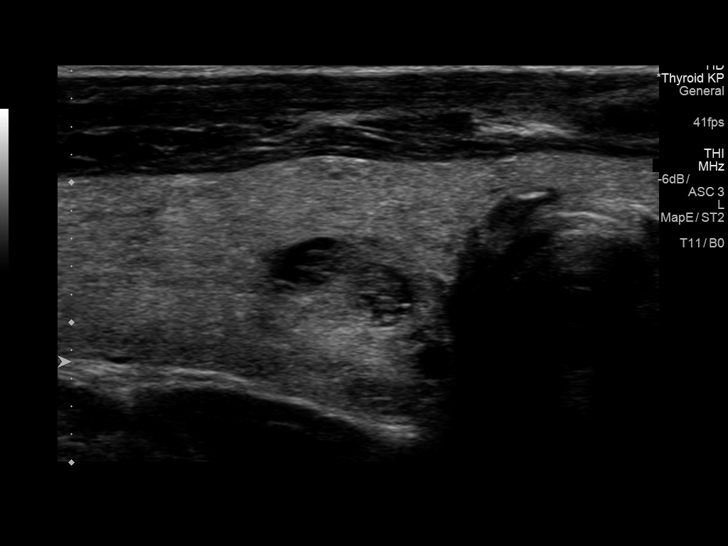
[im 37/89]
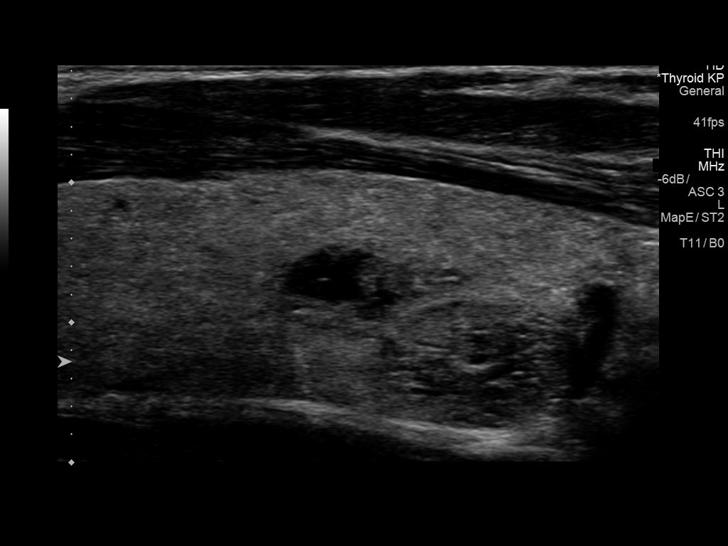
[im 45/89]
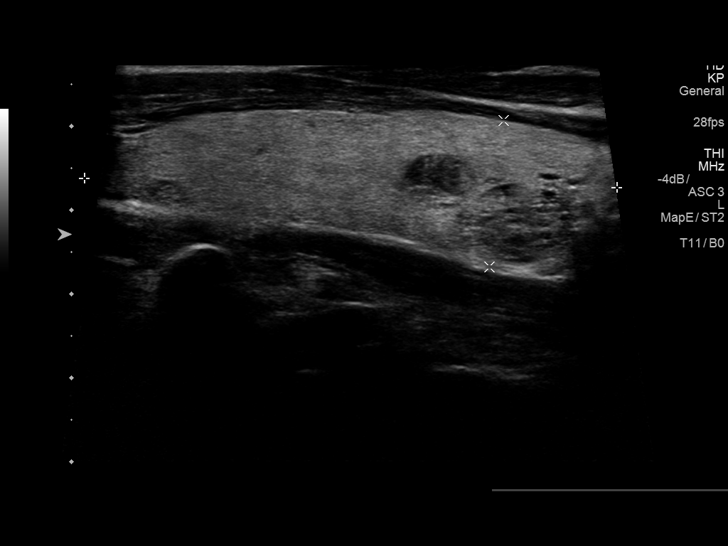
[im 52/89]
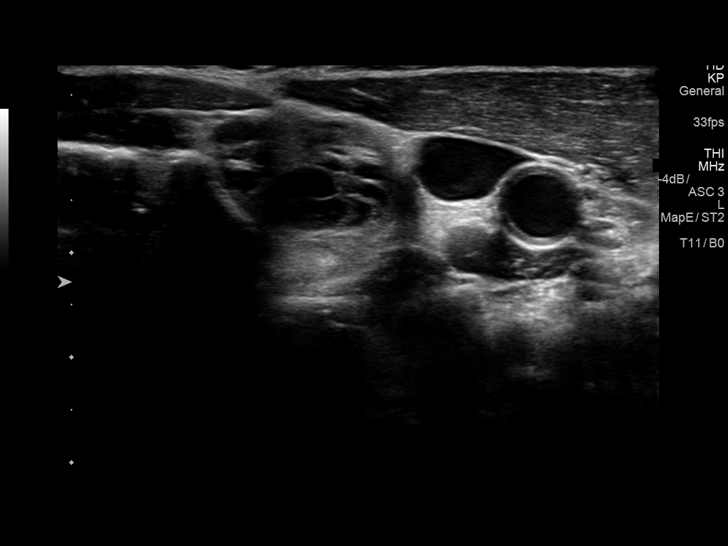
[im 59/89]
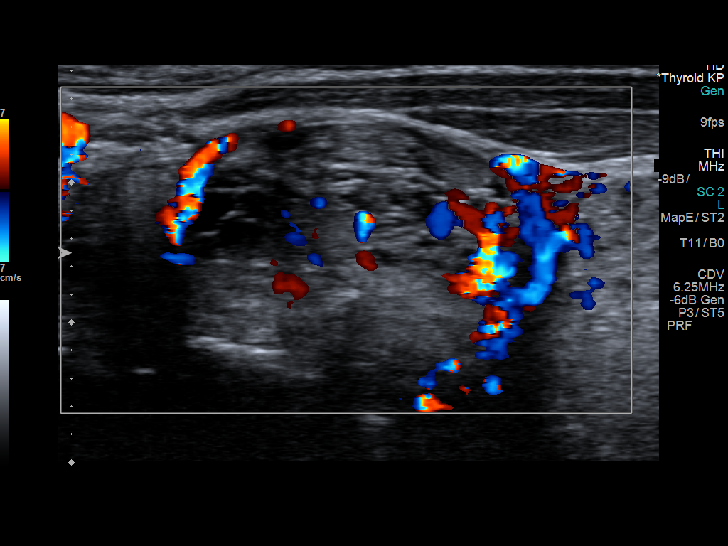
[im 67/89]
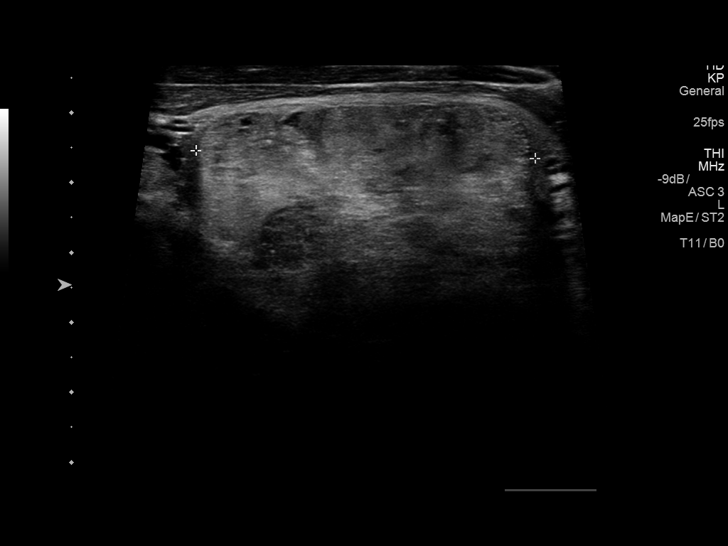
[im 74/89]
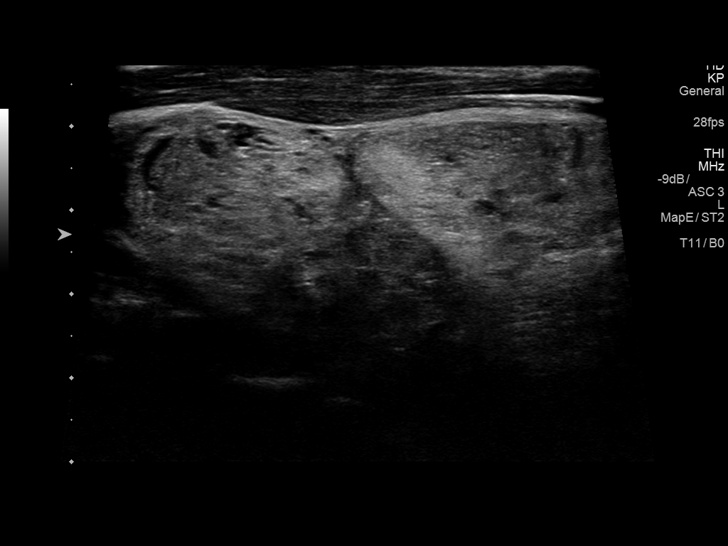
[im 81/89]
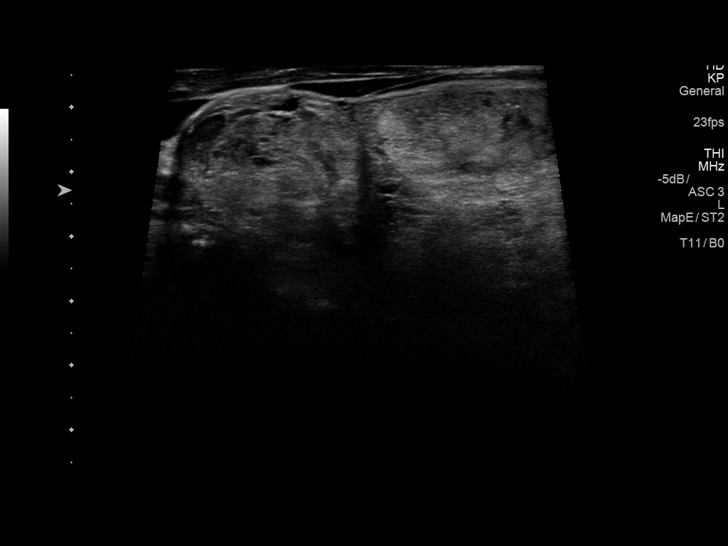
[im 89/89]
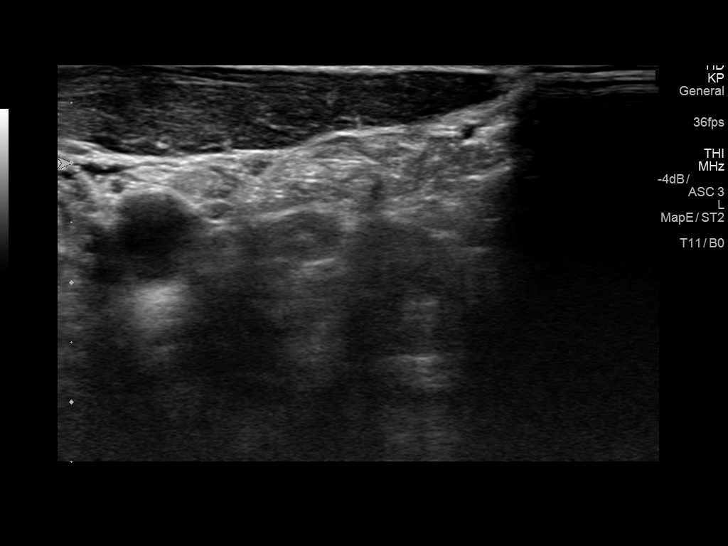

[13 of 25 positions shown; findings below may reference images not displayed]

FINDINGS: Parenchymal Echotexture: Markedly heterogenous

Isthmus: 1 cm thickness, previously

Right lobe: 6.4 x 1.8 x 2.1 cm, previously 6.1 x 1.8 x

Left lobe: 7.4 x 4 x 4.9 cm, previously 10 x 4.2 x 5

_________________________________________________________

Estimated total number of nodules >/= 1 cm: 4

Number of spongiform nodules >/= 2 cm not described below (TR1): 0

Number of mixed cystic and solid nodules >/= 1.5 cm not described
below (TR2): 0

_________________________________________________________

Nodule # [DATE]: 2.7 x 1.5 x 1.7 cm left isthmic nodule, previously
described as 2 separate 1.6 x 1.5 and 1.5 x 1.4 cm nodules; this was
previously biopsied

Nodule # 3: 0.8 x 0.6 x 0.4 cm hypoechoic superior right nodule
without calcifications, previously 0.9 x 0.6 x 0.4; does not meet
criteria for biopsy or dedicated imaging follow-up

Nodule # 4:

Prior biopsy: No

Location: Right; Inferior

Maximum size: 2.1 cm; Other 2 dimensions: 1.4 x 1.2 cm, previously,
2.1 x 1.6 x 1.3 cm

Composition: mixed cystic and solid (1)

Echogenicity: hypoechoic (2)

Shape: not taller-than-wide (0)

Margins: smooth (0)

Echogenic foci: none (0)

ACR TI-RADS total points: 3.

ACR TI-RADS risk category:  TR3 (3 points).

Significant change in size (>/= 20% in two dimensions and minimal
increase of 2 mm): No

Change in features: Yes

Change in ACR TI-RADS risk category: Yes

ACR TI-RADS recommendations:

*Given size (>/= 1.5 - 2.4 cm) and appearance, a follow-up
ultrasound in 1 year should be considered based on TI-RADS criteria.

_________________________________________________________

Nodule # 5: 2.4 x 1.7 x 1.4 cm anterior superior left, previously
1.8 x 1.6 x 1.6; this was previously biopsied

Nodule # 6: 4.8 x 4.5 x 3.3 cm inferior left, previously 7.2 x 4.6 x
3.6; this was previously biopsied
IMPRESSION: 1. Thyromegaly with bilateral nodules.
2. None meets current criteria for biopsy.
3. Recommend annual/biennial ultrasound follow-up of inferior right
nodule, until stability x5 years confirmed.

The above is in keeping with the ACR TI-RADS recommendations - [HOSPITAL] 9918;[DATE].

## 2019-10-10 ENCOUNTER — Ambulatory Visit: Payer: 59

## 2019-10-17 ENCOUNTER — Ambulatory Visit: Payer: 59 | Attending: Internal Medicine

## 2019-10-17 DIAGNOSIS — Z23 Encounter for immunization: Secondary | ICD-10-CM

## 2019-10-17 NOTE — Progress Notes (Signed)
   Covid-19 Vaccination Clinic  Name:  Kenneth Johnson    MRN: [id number] DOB: 08-05-1966  10/17/2019  [name] was observed post Covid-19 immunization for 15 minutes without incidence. He was provided with Vaccine Information Sheet and instruction to access the V-Safe system.   [name] was instructed to call 911 with any severe reactions post vaccine: Marland Kitchen Difficulty breathing  . Swelling of your face and throat  . A fast heartbeat  . A bad rash all over your body  . Dizziness and weakness    Immunizations Administered    Name Date Dose VIS Date Route   Moderna COVID-19 Vaccine 10/17/2019 11:16 AM 0.5 mL 07/21/2019 Intramuscular   Manufacturer: Moderna   Lot: [id number]   [id number]

## 2020-09-27 ENCOUNTER — Other Ambulatory Visit: Payer: Self-pay | Admitting: Otolaryngology

## 2020-09-27 DIAGNOSIS — E042 Nontoxic multinodular goiter: Secondary | ICD-10-CM

## 2020-10-14 ENCOUNTER — Ambulatory Visit
Admission: RE | Admit: 2020-10-14 | Discharge: 2020-10-14 | Disposition: A | Discharge status: Home / Self Care | Payer: 59 | Source: Ambulatory Visit | Attending: Otolaryngology | Admitting: Otolaryngology

## 2020-10-14 DIAGNOSIS — E042 Nontoxic multinodular goiter: Secondary | ICD-10-CM

## 2021-10-02 ENCOUNTER — Other Ambulatory Visit: Payer: Self-pay | Admitting: Otolaryngology

## 2021-10-02 DIAGNOSIS — E042 Nontoxic multinodular goiter: Secondary | ICD-10-CM

## 2021-10-06 ENCOUNTER — Ambulatory Visit
Admission: RE | Admit: 2021-10-06 | Discharge: 2021-10-06 | Disposition: A | Discharge status: Home / Self Care | Payer: 59 | Source: Ambulatory Visit | Attending: Otolaryngology | Admitting: Otolaryngology

## 2021-10-06 DIAGNOSIS — E042 Nontoxic multinodular goiter: Secondary | ICD-10-CM

## 2022-09-21 IMAGING — US US THYROID
1 series · 13 of 25 positions shown · non-contrast
Comparison: 10/14/2020, 03/13/2019, 04/20/2016

CLINICAL DATA: 55-year-old male with a history of thyroid goiter

Given history of prior biopsy of isthmic nodule, anterior left
thyroid nodule, posterior left thyroid nodule 03/09/2015
EXAM:
THYROID ULTRASOUND
TECHNIQUE: Ultrasound examination of the thyroid gland and adjacent soft
tissues was performed.

[Series 1: us thyroid · 0.12mm/px · 13 of 43 slices shown]
[im 1/43]
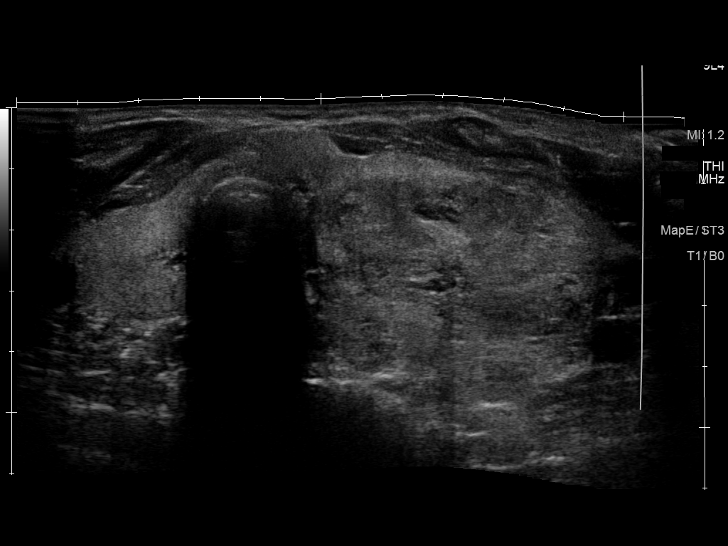
[im 4/43]
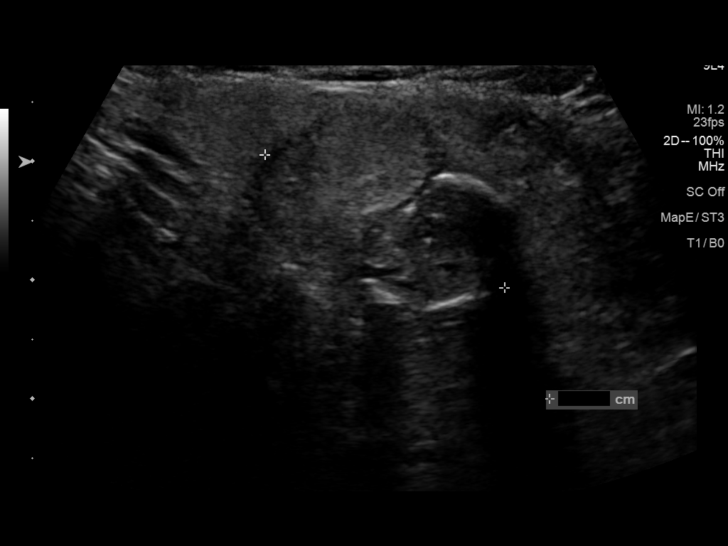
[im 8/43]
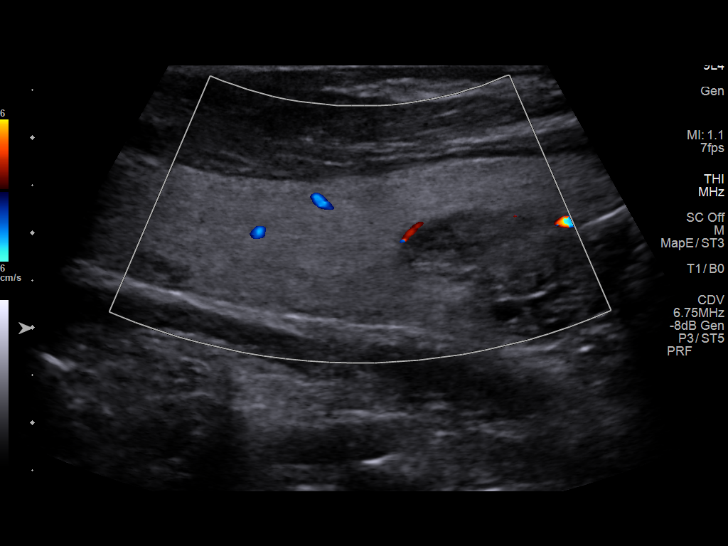
[im 11/43]
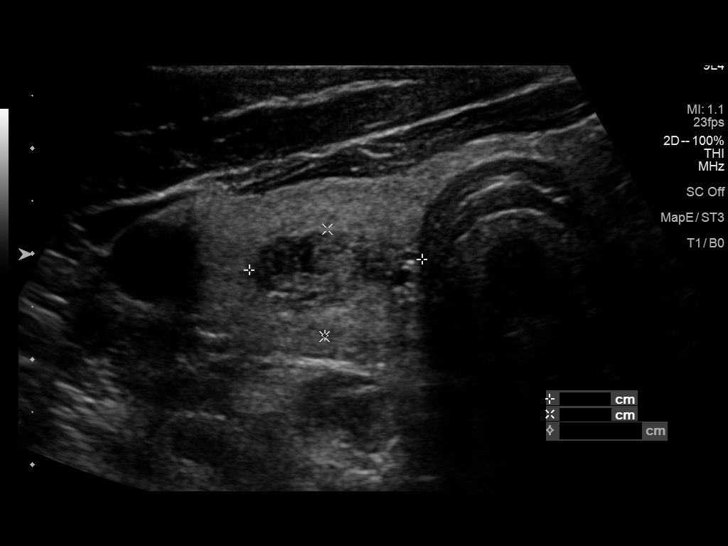
[im 15/43]
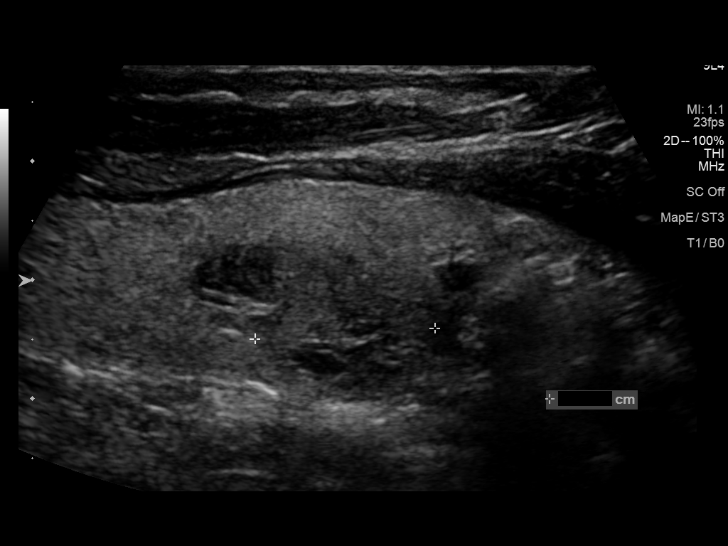
[im 18/43]
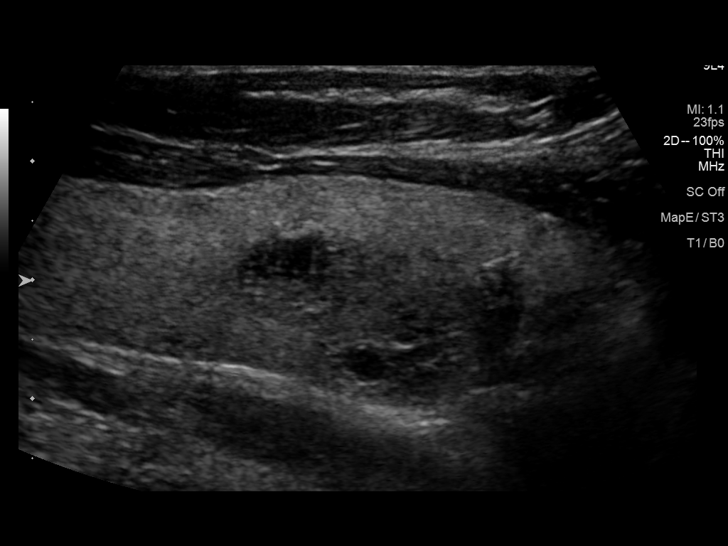
[im 22/43]
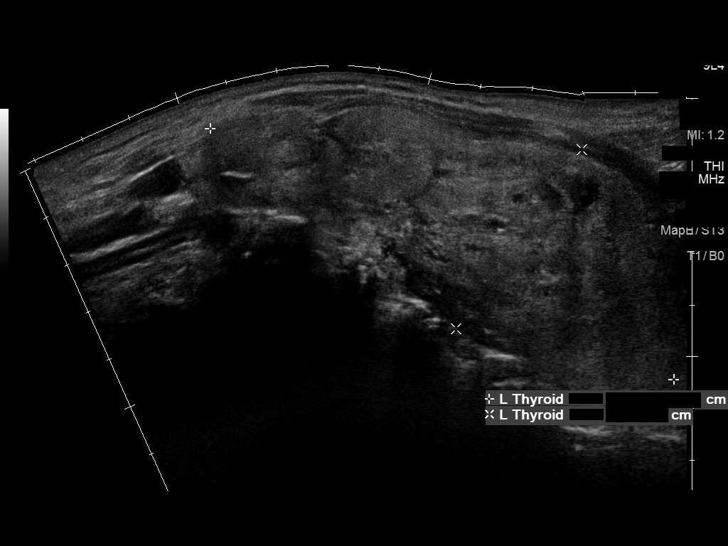
[im 25/43]
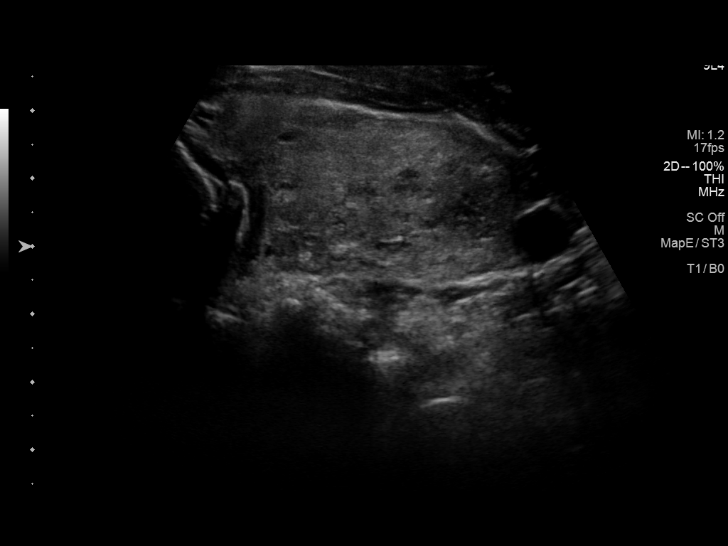
[im 29/43]
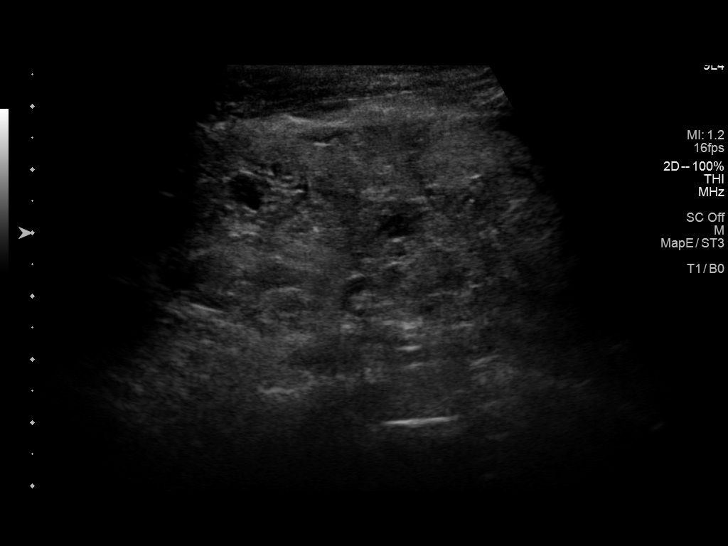
[im 32/43]
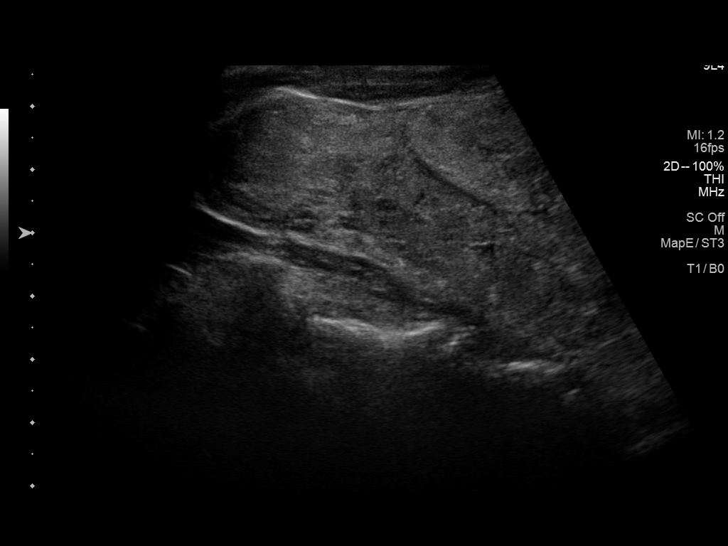
[im 36/43]
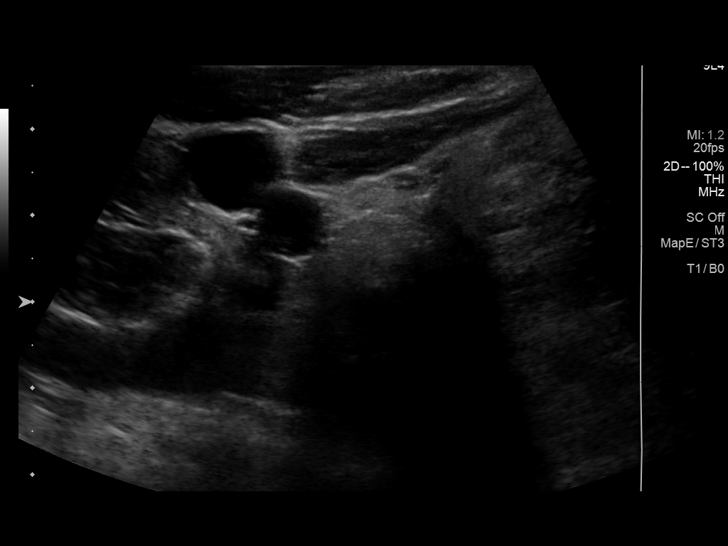
[im 39/43]
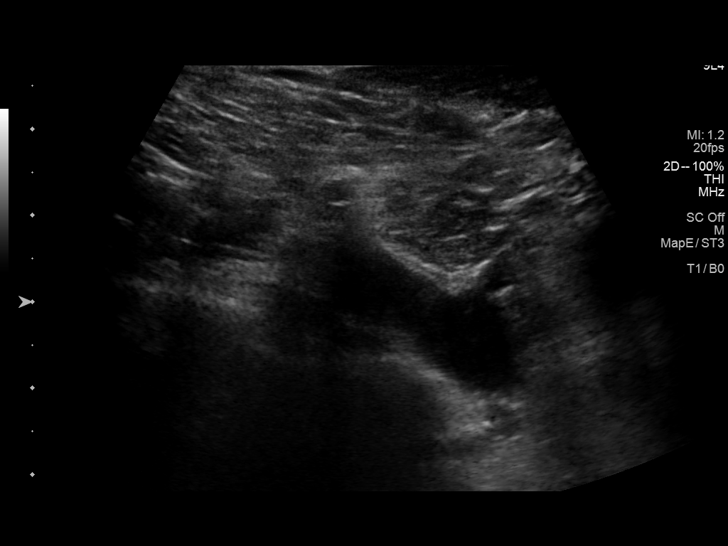
[im 43/43]
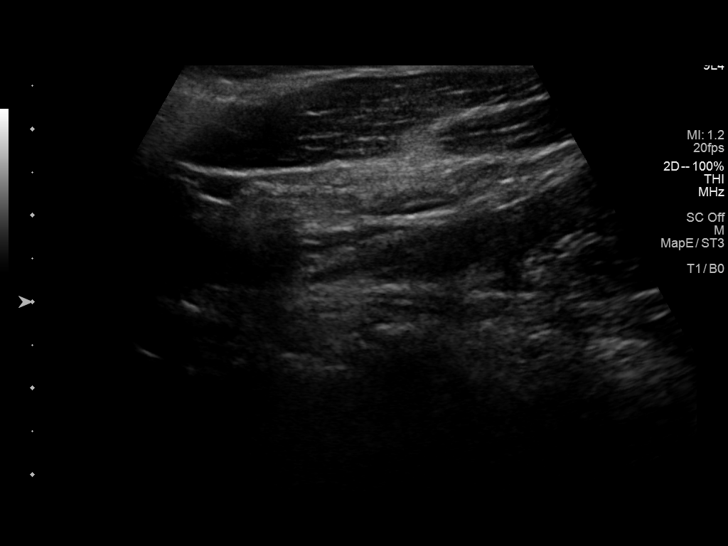

[13 of 25 positions shown; findings below may reference images not displayed]

FINDINGS: Parenchymal Echotexture: Moderately heterogenous

Isthmus: 0.7 cm

Right lobe: 6.4 cm x 2.1 cm x 2.2 cm

Left lobe: 10.1 cm x 4.2 cm x 5.6 cm

_________________________________________________________

Estimated total number of nodules >/= 1 cm: 3

Number of spongiform nodules >/=  2 cm not described below (TR1): 0

Number of mixed cystic and solid nodules >/= 1.5 cm not described
below (TR2): 0

_________________________________________________________

Nodule labeled 1 in the isthmus, unchanged, 2.3 cm. Nodule has been
previously biopsied. Assuming benign result, no further specific
follow-up would be indicated.

Nodule labeled 2 within the mid right thyroid. A side-by-side
sagittal/longitudinal ultrasound image on today's study (image
13/44) with a similar image from the study dated 04/20/2016 (image
15/63), shows that this configuration is unchanged in greater than 5
years. While previously measured as 1 nodule, currently these are
measured as nodule 2 and nodule 3. Given the unchanged size and
configuration over 5 years time, discontinuing any further
surveillance is reasonable.

No adenopathy.

Recommendations follow those established by the new ACR TI-RADS
criteria ([HOSPITAL] 3257;[DATE]).
IMPRESSION: Similar appearance of multinodular enlarged thyroid.

Greater than 5 years of stability has been demonstrated, as above,
and discontinuing any surveillance would be reasonable based on
established TIRADS criteria.

## 2023-07-29 ENCOUNTER — Other Ambulatory Visit: Payer: Self-pay | Admitting: Otolaryngology

## 2023-07-29 DIAGNOSIS — R1310 Dysphagia, unspecified: Secondary | ICD-10-CM

## 2023-07-29 DIAGNOSIS — E042 Nontoxic multinodular goiter: Secondary | ICD-10-CM

## 2023-08-15 ENCOUNTER — Ambulatory Visit
Admission: RE | Admit: 2023-08-15 | Discharge: 2023-08-15 | Disposition: A | Discharge status: Home / Self Care | Payer: 59 | Source: Ambulatory Visit | Attending: Otolaryngology | Admitting: Otolaryngology

## 2023-08-15 DIAGNOSIS — R1310 Dysphagia, unspecified: Secondary | ICD-10-CM

## 2023-08-15 DIAGNOSIS — E042 Nontoxic multinodular goiter: Secondary | ICD-10-CM

## 2024-03-10 ENCOUNTER — Institutional Professional Consult (permissible substitution): Admitting: Neurology

## 2024-03-18 ENCOUNTER — Ambulatory Visit (INDEPENDENT_AMBULATORY_CARE_PROVIDER_SITE_OTHER): Admitting: Neurology

## 2024-03-18 ENCOUNTER — Encounter: Payer: Self-pay | Admitting: Neurology

## 2024-03-18 VITALS — BP 119/68 | HR 55 | Ht 71.0 in | Wt 193.0 lb

## 2024-03-18 DIAGNOSIS — G4721 Circadian rhythm sleep disorder, delayed sleep phase type: Secondary | ICD-10-CM | POA: Diagnosis not present

## 2024-03-18 DIAGNOSIS — G4733 Obstructive sleep apnea (adult) (pediatric): Secondary | ICD-10-CM | POA: Insufficient documentation

## 2024-03-18 DIAGNOSIS — G4726 Circadian rhythm sleep disorder, shift work type: Secondary | ICD-10-CM | POA: Insufficient documentation

## 2024-03-18 MED ORDER — MELATONIN 3 MG PO TABS: 3.0000 mg | ORAL_TABLET | Freq: Every day | ORAL | Status: DC

## 2024-03-18 MED ORDER — MAGNESIUM GLUCONATE 250 MG PO TABS: 250.0000 mg | ORAL_TABLET | Freq: Every evening | ORAL | Status: AC

## 2024-03-18 NOTE — Progress Notes (Signed)
 SLEEP MEDICINE CLINIC    Provider:  Dedra Gores, MD  Primary Care Physician:  Lari Elspeth BRAVO, MD [address] [place] [address]     Referring Provider: Lari Elspeth BRAVO, Md [address] [place],  [place] [address]          Chief Complaint according to patient   Patient presents with:     New Patient (Initial Visit)     Pt alone, rm 2 states that he has never really established with sleep MD. His PCP who was able to order things. [facility]. Last SS 2014 which was a titration study by Dr Criss Tanda COME, in [place], [place]- . he can't find a baseline study and so to get supplies insurance is requiring him to have baseline study completed.       HISTORY OF PRESENT ILLNESS:  Kenneth Johnson is a 58 y.o. male patient who is seen upon [facility] referral on 03/18/2024 from PCP.  Chief concern according to patient : DcX  by Dr. Criss tanda , MD in 2014 , at [facility].   I have been given CPAP supplies and even a new CPAP machine just 2 years ago from Dr Lari, but I want to get  supplies and my compliance is not good enough for insurance to pay- , my medical interval history has changed.   [name] reports that he has had an back surgery and has some remaining left radiculopathy L5-S1.  He is not in pain usually at night, he reports that he has a nodular goiter but so far his thyroid  function has been normal, he has sometimes elevated blood pressures and he does have a note of some weight gain his BMI is 33.7.  No cardiac history, no pulmonary disorder, he does have some taxing sleep habits. 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 He has been diabetic since 2004, now on Jardiance inducing some nocturia.     The patient had the first sleep study in the year 2014.  The patient endorsed the Epworth sleepiness scale at 12 points the fatigue severity score of 21 points, and the geriatric depression score I harmful to geriatric, at 5 out of 15 points.   He also got a compliance download which shows that he is using an AutoSet at [employer] settings.  So these have never been reduced or standardized he has used the machine 20 out of 30 days again he is a swing shift worker, on average less than 4 hours.  His minimum pressure is 4 his maximum pressure 20 and his EPR is 1 cm water pressure.  His residual AHI is 1.3 which speaks for good resolution the 95th percentile pressure is 10.6 cm water and this is where we should set his machine to.  His air leaks are low 95th percentile 1.1 L so his current mask is a good fit for him.  He is using an AirTouch F20 and medium size this has a memory foam rim.  I quote here from the patient's available sleep study:  Khiree Bukhari underwent a CPAP titration in December 2014 after receiving the diagnosis of moderate obstructive sleep apnea the overnight study showed a total sleep time of 395 minutes and 90 minutes and REM sleep 21 minutes and slow-wave sleep CPAP was titrated from 4 cm water on to a maximum of 10 so very close to 2 days 95th percentile.  He did not drop  oxygen when on 10 cm water pressure he did have some snoring arousals in the early titration, he used a Radiographer, therapeutic and Paykel medium he saw him mask which was a nasal mask.  REM sleep onset was first seen in the second and it to last pressure setting and REM sleep was seen for over 50% once the patient had reached 10 cm water pressure.  2 given impression about the baseline the patient's AHI was 6.6 at 4 cm water and 4.1 at 6 cm water pressure.  The AHI became 0 at 10 cm water pressure.   Sleep relevant medical history: Nocturia 1-2, No ENT surgery,  No cervical spine disease, trauma, no TBI, but low back surgery with L 5 -S1 left leg nerve damage. No neuropathy.     Family medical /sleep history:  Brother  on CPAP with OSA, sister with DM, parents with HTN.    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   Gets sleepy during night a shifts,  and lives in a household with parents , no pets.. He  works in shifts( Chief Technology Officer,) one week day, one week nights.   Tobacco use: none.   ETOH use ; none ,  Caffeine intake in form of Coffee( /) Soda( 4 a day) Tea ( soda or tea), no energy drinks Exercise in form of golf..   Hobbies :golf       Sleep habits are as follows: During day shifts he patient's dinner time is between 8 PM. The patient goes to bed at 1 AM and continues to sleep for 3 hours , wakes at 4 AM  . Wakes for 1-2 bathroom breaks.  He only sleeps 3-4 hours nightly, reflected in low data on CPAP.  The preferred sleep position is laterally, with the support of 1-2 pillows, sleeps better when slightly elevated . GERD - improved with elevation.  Dreams are reportedly frequent/vivid.   The patient wakes up with an alarm. 4.30  AM is the usual rise time. At work at 6 AM.   He reports not feeling refreshed or restored in AM, with symptoms such as dry mouth,no  morning headaches just residual fatigue.  Naps are taken infrequently,  lack of opportunity,  he will fall asleep right after dinner - lasting from 45 to 60 minutes, naps at the kitchen table or on the couch,  and reportedly these naps. are more refreshing than nocturnal sleep.    Review of Systems: Out of a complete 14 system review, the patient complains of only the following symptoms, and all other reviewed systems are negative.:  Fatigue, sleepiness , shift wok, sleep restriction   No depression or anxiety    How likely are you to doze in the following situations: 0 = not likely, 1 = slight chance, 2 = moderate chance, 3 = high chance   Sitting and Reading? Watching Television? Sitting inactive in a public place (theater or meeting)? As a passenger in a car for an hour without a break? Lying down in the afternoon when circumstances permit? Sitting and talking to someone? Sitting quietly after lunch without alcohol ? In a  car, while stopped for a few minutes in traffic?   Total = 12/ 24 points   FSS endorsed at 21/ 63 points.   Social History   Socioeconomic History   Marital status: Single    Spouse name: Not on file   Number of children: Not on file  Years of education: Not on file   Highest education level: Not on file  Occupational History   Not on file  Tobacco Use   Smoking status: Never   Smokeless tobacco: Never  Vaping Use   Vaping status: Never Used  Substance and Sexual Activity   Alcohol  use: Never   Drug use: Never   Sexual activity: Not on file  Other Topics Concern   Not on file  Social History Narrative   Not on file   Social Drivers of Health   Financial Resource Strain: Low Risk  (12/12/2023)   Received from [facility]   Overall Financial Resource Strain (CARDIA)    Difficulty of Paying Living Expenses: Not hard at all  Food Insecurity: No Food Insecurity (12/12/2023)   [facility] from [facility]   Hunger Vital Sign    Within the past 12 months, you worried that your food would run out before you got the money to buy more.: Never true    Within the past 12 months, the food you bought just didn't last and you didn't have money to get more.: Never true  Transportation Needs: No Transportation Needs (12/12/2023)   Received from [facility] - Transportation    Lack of Transportation (Medical): No    Lack of Transportation (Non-Medical): No  Physical Activity: Not on file  Stress: Not on file  Social Connections: Feeling Socially Integrated (08/06/2022)   Received from [facility]   OASIS D0700: Social Isolation    Frequency of experiencing loneliness or isolation: Never    Family History  Problem Relation Age of Onset   Hypertension Mother    Colon polyps Father    Hypertension Father    Diabetes Sister    Colon polyps Brother     Past Medical History:  Diagnosis Date   DM (diabetes mellitus) (HCC)    Sleep apnea     Past  Surgical History:  Procedure Laterality Date   BACK SURGERY     L3-4 decompression   gum implants     ROTATOR CUFF REPAIR Right      Current Outpatient Medications on File Prior to Visit  Medication Sig Dispense Refill   acyclovir (ZOVIRAX) 400 MG tablet Take 400 mg by mouth daily.     aspirin EC 81 MG tablet Take 81 mg by mouth daily.     cetirizine (ZYRTEC) 10 MG tablet Take 10 mg by mouth daily.     finasteride (PROSCAR) 5 MG tablet Take 5 mg by mouth daily.     JARDIANCE 25 MG TABS tablet Take 25 mg by mouth daily.     lisinopril (PRINIVIL,ZESTRIL) 10 MG tablet Take 10 mg by mouth daily.     Magnesium  400 MG CAPS Take 400 mg by mouth daily.     metFORMIN (GLUCOPHAGE) 1000 MG tablet Take 1,000 mg by mouth 2 (two) times daily with a meal. (Patient taking differently: Take 1,000 mg by mouth daily with breakfast.)     rosuvastatin (CRESTOR) 5 MG tablet Take 5 mg by mouth daily.     tamsulosin (FLOMAX) 0.4 MG CAPS capsule Take 0.4 mg by mouth daily after breakfast.     No current facility-administered medications on file prior to visit.    No Known Allergies   DIAGNOSTIC DATA (LABS, IMAGING, TESTING) - I reviewed patient records, labs, notes, testing and imaging myself where available.  No results found for: WBC, HGB, HCT, MCV, PLT No results found for: NA,  K, CL, CO2, GLUCOSE, BUN, CREATININE, CALCIUM, PROT, ALBUMIN, AST, ALT, ALKPHOS, BILITOT, GFRNONAA, GFRAA No results found for: CHOL, HDL, LDLCALC, LDLDIRECT, TRIG, CHOLHDL No results found for: YHAJ8R No results found for: VITAMINB12 No results found for: TSH  PHYSICAL EXAM:  Today's Vitals   03/18/24 1202  BP: 119/68  Pulse: (!) 55  Weight: 193 lb (87.5 kg)  Height: 5' 11 (1.803 m)   Body mass index is 26.92 kg/m.   Wt Readings from Last 3 Encounters:  03/18/24 193 lb (87.5 kg)  02/06/18 175 lb (79.4 kg)     Ht Readings from Last 3 Encounters:   03/18/24 5' 11 (1.803 m)  02/06/18 5' 11 (1.803 m)      General: The patient is awake, alert and appears not in acute distress. The patient is well groomed. Head: Normocephalic, atraumatic. Neck is supple. Mallampati 3 plus,  neck circumference:17 inches . Nasal airflow not fully patent.  Retrognathia is  seen.  Dental status:  biological with a partial and implants , no braces.  Cardiovascular:  Regular rate and cardiac rhythm by pulse,  without distended neck veins. Respiratory: Lungs are clear to auscultation.  Skin:  Without evidence of ankle edema, or rash. Trunk: The patient's posture is erect.   NEUROLOGIC EXAM: The patient is awake and alert, oriented to place and time.   Memory subjective described as intact.  Attention span & concentration ability appears normal.  Speech is fluent,  without  dysarthria, dysphonia or aphasia.  Mood and affect are appropriate.   Cranial nerves: no loss of smell or taste reported  Pupils are equal and briskly reactive to light. Funduscopic exam def. .  Extraocular movements in vertical and horizontal planes were intact and without nystagmus. No Diplopia. Visual fields by finger perimetry are intact. Hearing was intact to soft voice and finger rubbing.    Facial sensation intact to fine touch.  Facial motor strength was symmetric and tongue was midline.  Neck ROM : rotation, tilt and flexion extension were normal for age and shoulder shrug was symmetrical.    Motor exam:  Symmetric bulk, tone and ROM.   Normal tone without cog -wheeling, asymmetric grip strength, less on the right , carpal tunnel    Sensory:  Fine touch and vibration  impaired in the index finger , right hand , radiculopathic changes in the left  leg  Proprioception tested in the upper extremities was normal.   Coordination: Rapid alternating movements in the fingers/hands were of normal speed.  The Finger-to-nose maneuver was intact without evidence of ataxia, dysmetria  or tremor.   Gait and station: Patient could rise unassisted from a seated position, walked without assistive device.  Toe and heel walk were deferred.  Deep tendon reflexes: in the  upper and lower extremities are symmetric and intact.      ASSESSMENT AND PLAN 58 y.o. year old male  here with:    1) longstanding  history of  OSA dx in 2014, and  on CPAP since. Good apnea control but poor compliance.   2)  restricted sleep time, late bedtime even when working dayshift, allowing the patient only a maximum of 4 hours of sleep, but he does have usually time time for an hour of a nap right after dinner.  We discussed some sleep hygiene changes that would improve his compliance which is important to get CPAP supplies covered.  He has to advance his dinnertime and his bedtime, restrained the use of screens or  screen lights in the bedroom for at least an hour before desired bedtime is desired bedtime should be 6 hours minimum prior to him having to rise in the morning.  So we should aim for 10 PM. He should have his dinner by 7 PM, he may want to do a walk or some kind of physical activity after dinner and then give himself some time to chill, take a hot bath or shower before bedtime he may want to take try melatonin 5 mg or less orally at bedtime and I would recommend to listen to an audiobook or to read in a book with pages and then in can do some light.  LED lights are also very disturbing for sleep.  Reading on the device is not  the same -no no sleep improvement quality.  I would like not to use any prescription sleep aids in general this is a behavior entrainment that is needed to change. Magnesium  is permitted.   I will offer him a home sleep test to have a new baseline but since his 95th percentile pressure on auto titration is still very close to his original prescription pressure I think that his underlying apnea probably has not changed that much.  Still in order to get supplies that may be  helpful to document the ongoing need for his CPAP therapy.  So this will be a home sleep test and can be mailed to the patient's house.  His goal has to be 4 hours of suse that time on CPAP.  It can be more- but it can't be less than 4 hours.  HST ordered.   I plan to follow up  through our NP within 5 -6 months.   I would like to thank Burdine, Elspeth BRAVO, MD and Lari Elspeth BRAVO, Md [address] [place],  [place] [address] for allowing me to meet with and to take care of this pleasant patient.   Discussion of sleep hygiene setting bedtime and rise time,  hot shower  before bed time, no screen light in the bedroom, the bedroom should be cool, quiet and dark. Night lights should illuminate the floor not shine into your eyes. Golden glow  light is less intrusive than blue or cold light.  Read in a book with pages, not on a device. Consider audio books and soothing  sound -scapes.    After spending a total time of  40  minutes face to face and additional time for physical and neurologic examination, review of laboratory studies,  personal review of imaging studies, reports and results of other testing and review of referral information / records as far as provided in visit,   Electronically signed by: Dedra Gores, MD 03/18/2024 12:15 PM  [facility] Sleep Board certified by The [employer] of Sleep Medicine and Diplomate of the [employer] of Sleep Medicine. Board certified In Neurology through the ABPN, Fellow of the [employer] of Neurology.

## 2024-03-18 NOTE — Patient Instructions (Addendum)
 58 y.o. year old male  here with:    1) longstanding  history of  OSA dx in 2014, and  on CPAP since. Good apnea control but poor compliance.   2)  restricted sleep time, late bedtime even when working dayshift, allowing the patient only a maximum of 4 hours of sleep, but he does have usually time time for an hour of a nap right after dinner.  We discussed some sleep hygiene changes that would improve his compliance which is important to get CPAP supplies covered.  He has to advance his dinnertime and his bedtime, restrained the use of screens or screen lights in the bedroom for at least an hour before desired bedtime is desired bedtime should be 6 hours minimum prior to him having to rise in the morning.  So we should aim for 10 PM. He should have his dinner by 7 PM, he may want to do a walk or some kind of physical activity after dinner and then give himself some time to chill, take a hot bath or shower before bedtime he may want to take try melatonin 5 mg or less orally at bedtime and I would recommend to listen to an audiobook or to read in a book with pages and then in can do some light.  LED lights are also very disturbing for sleep.  Reading on the device is not  the same -no no sleep improvement quality.  I would like not to use any prescription sleep aids in general this is a behavior entrainment that is needed to change. Magnesium  is permitted.   I will offer him a home sleep test to have a new baseline but since his 95th percentile pressure on auto titration is still very close to his original prescription pressure I think that his underlying apnea probably has not changed that much.  Still in order to get supplies that may be helpful to document the ongoing need for his CPAP therapy.  So this will be a home sleep test and can be mailed to the patient's house.  His goal has to be 4 hours of suse that time on CPAP.  It can be more- but it can't be less than 4 hours.    follow up  through our NP within 5 -6 months.   Quality Sleep Information, Adult Quality sleep is important for your mental and physical health. It also improves your quality of life. Quality sleep means you: Are asleep for most of the time you are in bed. Fall asleep within 30 minutes. Wake up no more than once a night. Are awake for no longer than 20 minutes if you do wake up during the night. Most adults need 7-8 hours of quality sleep each night. How can poor sleep affect me? If you do not get enough quality sleep, you may have: Mood swings. Daytime sleepiness. Decreased alertness, reaction time, and concentration. Sleep disorders, such as insomnia and sleep apnea. Difficulty with: Solving problems. Coping with stress. Paying attention. These issues may affect your performance and productivity at work, school, and home. Lack of sleep may also put you at higher risk for accidents, suicide, and risky behaviors. If you do not get quality sleep, you may also be at higher risk for several health problems, including: Infections. Type 2 diabetes. Heart disease. High blood pressure. Obesity. Worsening of long-term conditions, like arthritis, kidney disease, depression, Parkinson's disease, and epilepsy. What actions can I take to get more quality sleep? Sleep schedule and  routine Stick to a sleep schedule. Go to sleep and wake up at about the same time each day. Do not try to sleep less on weekdays and make up for lost sleep on weekends. This does not work. Limit naps during the day to 30 minutes or less. Do not take naps in the late afternoon. Make time to relax before bed. Reading, listening to music, or taking a hot bath promotes quality sleep. Make your bedroom a place that promotes quality sleep. Keep your bedroom dark, quiet, and at a comfortable room temperature. Make sure your bed is comfortable. Avoid using electronic devices that give off bright blue light for 30 minutes before bedtime.  Your brain perceives bright blue light as sunlight. This includes television, phones, and computers. If you are lying awake in bed for longer than 20 minutes, get up and do a relaxing activity until you feel sleepy. Lifestyle     Try to get at least 30 minutes of exercise on most days. Do not exercise 2-3 hours before going to bed. Do not use any products that contain nicotine or tobacco. These products include cigarettes, chewing tobacco, and vaping devices, such as e-cigarettes. If you need help quitting, ask your health care provider. Do not drink caffeinated beverages for at least 8 hours before going to bed. Coffee, tea, and some sodas contain caffeine. Do not drink alcohol  or eat large meals close to bedtime. Try to get at least 30 minutes of sunlight every day. Morning sunlight is best. Medical concerns Work with your health care provider to treat medical conditions that may affect sleeping, such as: Nasal obstruction. Snoring. Sleep apnea and other sleep disorders. Talk to your health care provider if you think any of your prescription medicines may cause you to have difficulty falling or staying asleep. If you have sleep problems, talk with a sleep consultant. If you think you have a sleep disorder, talk with your health care provider about getting evaluated by a specialist. Where to find more information Sleep Foundation: sleepfoundation.org American Academy of Sleep Medicine: aasm.org Centers for Disease Control and Prevention (CDC): TonerPromos.no Contact a health care provider if: You have trouble getting to sleep or staying asleep. You often wake up very early in the morning and cannot get back to sleep. You have daytime sleepiness. You have daytime sleep attacks of suddenly falling asleep and sudden muscle weakness (narcolepsy). You have a tingling sensation in your legs with a strong urge to move your legs (restless legs syndrome). You stop breathing briefly during sleep (sleep  apnea). You think you have a sleep disorder or are taking a medicine that is affecting your quality of sleep. Summary Most adults need 7-8 hours of quality sleep each night. Getting enough quality sleep is important for your mental and physical health. Make your bedroom a place that promotes quality sleep, and avoid things that may cause you to have poor sleep, such as alcohol , caffeine, smoking, or large meals. Talk to your health care provider if you have trouble falling asleep or staying asleep. This information is not intended to replace advice given to you by your health care provider. Make sure you discuss any questions you have with your health care provider. Document Revised: 11/29/2021 Document Reviewed: 11/29/2021 Elsevier Patient Education  2024 [employer].

## 2024-03-19 NOTE — Progress Notes (Signed)
 Anam Bobby D, CMA  Joylene Bradley; Garcia, Patricia; Ziegler, Melissa; Tucker, Dolanda; Sheree, [place] New orders have been placed for the above pt, DOB: 2065/12/26 Thanks  Has a RESMED 11 (set up 1-2 years ago) [id number] once baseline SS complete can get set up with [facility] and have them tag device to our office

## 2024-04-24 ENCOUNTER — Ambulatory Visit: Admitting: Neurology

## 2024-04-24 DIAGNOSIS — G4733 Obstructive sleep apnea (adult) (pediatric): Secondary | ICD-10-CM | POA: Diagnosis not present

## 2024-04-24 DIAGNOSIS — G4721 Circadian rhythm sleep disorder, delayed sleep phase type: Secondary | ICD-10-CM

## 2024-04-24 DIAGNOSIS — G4726 Circadian rhythm sleep disorder, shift work type: Secondary | ICD-10-CM

## 2024-05-06 ENCOUNTER — Telehealth: Payer: Self-pay

## 2024-05-06 NOTE — Telephone Encounter (Signed)
 Spoke w/Pt to make him aware the results of his sleep test have not been released or reviewed at this time but once the provider has a chance to review and gives us  notes we will reach out to him with the results. Pt voiced understanding and thanks for the call.

## 2024-05-06 NOTE — Telephone Encounter (Signed)
 The results of the HST have not be released on the [facility] patient portal. As soon as I get the notification from [facility], I will get the results ready for MD to read.

## 2024-05-06 NOTE — Telephone Encounter (Signed)
 Pt LVM on sleep lab phone asking about sleep study results

## 2024-05-19 NOTE — Progress Notes (Unsigned)
 Kenneth Johnson

## 2024-05-21 ENCOUNTER — Ambulatory Visit: Payer: Self-pay | Admitting: Neurology

## 2024-05-21 DIAGNOSIS — G4726 Circadian rhythm sleep disorder, shift work type: Secondary | ICD-10-CM

## 2024-05-21 DIAGNOSIS — I159 Secondary hypertension, unspecified: Secondary | ICD-10-CM

## 2024-05-21 DIAGNOSIS — G4721 Circadian rhythm sleep disorder, delayed sleep phase type: Secondary | ICD-10-CM

## 2024-05-21 DIAGNOSIS — G4733 Obstructive sleep apnea (adult) (pediatric): Secondary | ICD-10-CM

## 2024-05-21 LAB — BASIC METABOLIC PANEL WITH GFR
BUN: 15 (ref 4–21)
CO2: 28 — AB (ref 13–22)
Chloride: 105 (ref 99–108)
Creatinine: 1 (ref 0.6–1.3)
Potassium: 4.3 meq/L (ref 3.5–5.1)
Sodium: 141 (ref 137–147)

## 2024-05-21 LAB — COMPREHENSIVE METABOLIC PANEL WITH GFR
Albumin: 4.1 (ref 3.5–5.0)
Calcium: 9.1 (ref 8.7–10.7)
eGFR: 84.9

## 2024-05-21 LAB — LIPID PANEL
Cholesterol: 118 (ref 0–200)
HDL: 48 (ref 35–70)
LDL Cholesterol: 60
Triglycerides: 51 (ref 40–160)

## 2024-05-21 LAB — HEPATIC FUNCTION PANEL
ALT: 36 U/L (ref 10–40)
AST: 39 (ref 14–40)
Alkaline Phosphatase: 70 (ref 25–125)
Bilirubin, Total: 0.5

## 2024-05-21 LAB — VITAMIN D 25 HYDROXY (VIT D DEFICIENCY, FRACTURES): Vit D, 25-Hydroxy: 48.8

## 2024-05-21 NOTE — Procedures (Signed)
 [facility]   HOME SLEEP TEST REPORT ( by Elene  mail -out device )    Kenneth Johnson 58 year old male Apr 26, 1966   The SANSA single-point-of-skin-contact chest-worn sensor - an FDA cleared and DOT approved type 4 home sleep test device - measures eight physiological channels,  including blood oxygen saturation (measured via PPG [photoplethysmography]), EKG-derived heart rate, respiratory effort, chest movement (measured via accelerometer), snoring, body position, and actigraphy. The device is designed to be worn for up to 10 hours per study.    STUDY DATE:  04-24-2024 Data received : 05-19-2024    ORDERING CLINICIAN:  Dedra Gores, MD  REFERRING CLINICIAN: Elspeth Messier, MD    CLINICAL INFORMATION/HISTORY:  Last PSG 2014 which was a titration study by Dr Criss Tanda COME, in [place], [place]- .we can't find a baseline study and patient needs TOC for CPAP supplies, insurance is requiring him to have a new baseline study completed. His minimum pressure is 4 his maximum pressure 20 and his EPR is 1 cm water pressure. His residual AHI is 1.3/h,He is using an AirTouch F20 and medium size this has a memory foam rim.  Longstanding  history of  OSA dx in 2014, and  on CPAP since. Good apnea control but poor compliance.    2)  restricted sleep time, late bedtime even when working dayshift, allowing the patient only a maximum of 4 hours of sleep, but he does have usually time time for an hour of a nap right after dinner.  We discussed some sleep hygiene changes that would improve his compliance which is important to get CPAP supplies covered.   Epworth sleepiness score:12/ 24 points   FSS endorsed at 21/ 63 points.   BMI: 27 kg/m  Neck Circumference: 17   FINDINGS:  Sleep Summary:   Start Recording Time (hours, min):  11;55 PM on 04-27-2024      Total Sleep Time (hours, min):   6 h 18 m  Sleep efficiency %;      66%                                 Respiratory Indices by AASM  criteria  of scoring; Calculated pAHI (per hour):  7.3/h , CMS based AHI would be 4.6/h .                                         Positional  respiratory activity  / snoring : mild   Oxygen Saturation  in Sleep    Oxygen Saturation (%) Mean:   95.7%             O2 Saturation Range (%):  81 %-100%                                 O2 Saturation (minutes) <89%:  < 1 minute         Pulse Rate in Sleep :   Pulse Mean (bpm): 50 bpm              Pulse Range: 42 bpm - 84 bpm                 IMPRESSION:  This HST confirms the presence of surprisingly very mild sleep apnea and found no  clinically significant hypoxia.    RECOMMENDATION: CPAP therapy would be optional for any patient with such mild apnea.  If CPAP use prevents daytime sleepiness or morning headaches, please continue with CPAP use and I will be happy to prescribe a new machine . If the patient is not interested in CPAP continuation, he would not have to obtain a new machine and has no need to follow up .     Any Patient endorsing a high level of sleepiness should be cautioned not to drive, work at heights, or operate dangerous machinery or heavy equipment when tired or sleepy.  Review of good sleep hygiene measures took place in the initial consultation but should be revisited ( Your guide to better sleep  a publication by the NIH is a good source of information).   The referring provider will be notified of the test results.    I certify that I have reviewed the raw data recording prior to the issuance of this report in accordance with the standards of the American Academy of Sleep Medicine (AASM).    NTERPRETING PHYSICIAN:   Dedra Gores, MD  [facility] and [facility] Sleep Board certified by The [employer] of Sleep Medicine and Diplomate of the [employer] of Sleep Medicine. Board certified In Neurology through the ABPN, Fellow of the [employer] of Neurology.

## 2024-05-25 ENCOUNTER — Encounter: Payer: Self-pay | Admitting: Internal Medicine

## 2024-05-25 ENCOUNTER — Ambulatory Visit: Admitting: Internal Medicine

## 2024-05-25 VITALS — BP 110/80 | HR 76 | Temp 98.2°F | Ht 71.0 in | Wt 194.7 lb

## 2024-05-25 DIAGNOSIS — N401 Enlarged prostate with lower urinary tract symptoms: Secondary | ICD-10-CM | POA: Diagnosis not present

## 2024-05-25 DIAGNOSIS — R351 Nocturia: Secondary | ICD-10-CM

## 2024-05-25 DIAGNOSIS — E1169 Type 2 diabetes mellitus with other specified complication: Secondary | ICD-10-CM

## 2024-05-25 DIAGNOSIS — Z23 Encounter for immunization: Secondary | ICD-10-CM

## 2024-05-25 DIAGNOSIS — I10 Essential (primary) hypertension: Secondary | ICD-10-CM

## 2024-05-25 DIAGNOSIS — Z7984 Long term (current) use of oral hypoglycemic drugs: Secondary | ICD-10-CM

## 2024-05-25 DIAGNOSIS — E785 Hyperlipidemia, unspecified: Secondary | ICD-10-CM

## 2024-05-25 DIAGNOSIS — E119 Type 2 diabetes mellitus without complications: Secondary | ICD-10-CM | POA: Insufficient documentation

## 2024-05-25 DIAGNOSIS — N400 Enlarged prostate without lower urinary tract symptoms: Secondary | ICD-10-CM | POA: Insufficient documentation

## 2024-05-25 MED ORDER — TIRZEPATIDE 2.5 MG/0.5ML ~~LOC~~ SOAJ: 2.5000 mg | SUBCUTANEOUS | 0 refills | Status: DC

## 2024-05-25 NOTE — Addendum Note (Signed)
 Addended by: KATHRYNE MILLMAN B on: 05/25/2024 12:04 PM   Modules accepted: Orders

## 2024-05-25 NOTE — Assessment & Plan Note (Signed)
 Most recent lipid panel with a total cholesterol of 118, triglycerides 51, HDL 48 and LDL 59.  On rosuvastatin 5 mg daily.

## 2024-05-25 NOTE — Assessment & Plan Note (Signed)
 Well-controlled on current.

## 2024-05-25 NOTE — Assessment & Plan Note (Signed)
 Not controlled with an A1c of 8.0.  Continue metformin and Jardiance, add Mounjaro 2.5 mg, hope to increase dose monthly until tolerated dose.  If A1c decreases over the next 6 months can consider discontinuing metformin.  Foot exam done today

## 2024-05-25 NOTE — Progress Notes (Addendum)
 New Patient Office Visit     CC/Reason for Visit: Establish care, follow-up chronic medical conditions Previous PCP: Garnette Messier, MD Last Visit: Unknown  HPI: Kenneth Johnson is a 58 y.o. male who is coming in today for the above mentioned reasons. Past Medical History is significant for: Hypertension, hyperlipidemia, type 2 diabetes, BPH.  He recently had a sleep study due to concerns for OSA with results currently pending.  He is feeling well.  Has routine eye and dental care.  He is concerned about his A1c that remains elevated to 8.0.  He had a colonoscopy by Dr. Kristie in 2025.   Past Medical/Surgical History: Past Medical History:  Diagnosis Date   BPH (benign prostatic hyperplasia)    DM (diabetes mellitus) (HCC)    Hyperlipidemia associated with type 2 diabetes mellitus (HCC)    Hypertension    Sleep apnea     Past Surgical History:  Procedure Laterality Date   BACK SURGERY     L3-4 decompression   gum implants     ROTATOR CUFF REPAIR Right     Social History:  reports that he has never smoked. He has never used smokeless tobacco. He reports that he does not drink alcohol  and does not use drugs.  Allergies: No Known Allergies  Family History:  Family History  Problem Relation Age of Onset   Hypertension Mother    Colon polyps Father    Hypertension Father    Diabetes Sister    Colon polyps Brother      Current Outpatient Medications:    acyclovir (ZOVIRAX) 400 MG tablet, Take 400 mg by mouth daily., Disp: , Rfl:    aspirin EC 81 MG tablet, Take 81 mg by mouth daily., Disp: , Rfl:    cetirizine (ZYRTEC) 10 MG tablet, Take 10 mg by mouth daily., Disp: , Rfl:    finasteride (PROSCAR) 5 MG tablet, Take 5 mg by mouth daily., Disp: , Rfl:    JARDIANCE 25 MG TABS tablet, Take 25 mg by mouth daily., Disp: , Rfl:    lisinopril (PRINIVIL,ZESTRIL) 10 MG tablet, Take 10 mg by mouth daily., Disp: , Rfl:    Magnesium  Gluconate 250 MG TABS, Take 1 tablet (250 mg  total) by mouth at bedtime., Disp: , Rfl:    metFORMIN (GLUCOPHAGE) 1000 MG tablet, Take 1,000 mg by mouth 2 (two) times daily with a meal., Disp: , Rfl:    rosuvastatin (CRESTOR) 5 MG tablet, Take 5 mg by mouth daily., Disp: , Rfl:    tamsulosin (FLOMAX) 0.4 MG CAPS capsule, Take 0.4 mg by mouth daily after breakfast., Disp: , Rfl:    tirzepatide (MOUNJARO) 2.5 MG/0.5ML Pen, Inject 2.5 mg into the skin once a week., Disp: 2 mL, Rfl: 0  Review of Systems:  Negative except as indicated in HPI.   Physical Exam: Vitals:   05/25/24 0916  BP: 110/80  Pulse: 76  Temp: 98.2 F (36.8 C)  TempSrc: Oral  SpO2: 98%  Weight: 194 lb 11.2 oz (88.3 kg)  Height: 5' 11 (1.803 m)   Body mass index is 27.16 kg/m.  Physical Exam Vitals reviewed.  Constitutional:      Appearance: Normal appearance.  HENT:     Head: Normocephalic and atraumatic.  Eyes:     Conjunctiva/sclera: Conjunctivae normal.  Cardiovascular:     Rate and Rhythm: Normal rate and regular rhythm.  Pulmonary:     Effort: Pulmonary effort is normal.     Breath sounds: Normal  breath sounds.  Skin:    General: Skin is warm and dry.  Neurological:     General: No focal deficit present.     Mental Status: He is alert and oriented to person, place, and time.  Psychiatric:        Mood and Affect: Mood normal.        Behavior: Behavior normal.        Thought Content: Thought content normal.        Judgment: Judgment normal.       Impression and Plan:  Type 2 diabetes mellitus with other specified complication, without long-term current use of insulin (HCC) Assessment & Plan: Not controlled with an A1c of 8.0.  Continue metformin and Jardiance, add Mounjaro 2.5 mg, hope to increase dose monthly until tolerated dose.  If A1c decreases over the next 6 months can consider discontinuing metformin.    Orders: -     Tirzepatide; Inject 2.5 mg into the skin once a week.  Dispense: 2 mL; Refill: 0  Hyperlipidemia associated  with type 2 diabetes mellitus (HCC) Assessment & Plan: Most recent lipid panel with a total cholesterol of 118, triglycerides 51, HDL 48 and LDL 59.  On rosuvastatin 5 mg daily.   Primary hypertension Assessment & Plan: Well-controlled on current.   Benign prostatic hyperplasia with nocturia Assessment & Plan: Well-controlled on tamsulosin and finasteride.  Follows with urology.   Immunization due  - Flu vaccine administered in office today.  Time spent: 46 minutes reviewing chart, interviewing and examining patient and formulating plan of care.    Tully Theophilus Andrews, MD [facility] at [facility]

## 2024-05-25 NOTE — ED Triage Notes (Signed)
 Fell six feet off ladder, reaching for branch and branch broke about 1830; LOC noted, patient not sure what he hit when he fell. C/o right rib pain, left wrist pain,  and left side of head. Neuro intact.

## 2024-05-25 NOTE — Assessment & Plan Note (Signed)
 Well-controlled on tamsulosin and finasteride.  Follows with urology.

## 2024-05-25 NOTE — ED Notes (Signed)
 ED Procedure Note  EKG Interpretation  Date/Time: 05/25/2024 11:03 PM  Performed by: [place] Norleen Agent, MD Authorized by: [place] Norleen Agent, MD   ECG interpreted by ED Physician in the absence of a cardiologist: yes   Previous ECG:    Previous ECG:  Unavailable Interpretation:    Interpretation: normal   Rate:    ECG rate:  65   ECG rate assessment: normal   Rhythm:    Rhythm: sinus rhythm   Ectopy:    Ectopy: none   QRS:    QRS axis:  Normal   QRS intervals:  Normal   QRS conduction: normal   ST segments:    ST segments:  Normal T waves:    T waves: normal   Q waves:    Abnormal Q-waves: not present   Comments:     Sinus rhythm 65 no acute ST-T wave changes normal intervals

## 2024-05-25 NOTE — ED Provider Notes (Addendum)
 Emergency Department Provider Note    ED Clinical Impression   Final diagnoses:  Fall, initial encounter (Primary)  Injury of head, initial encounter  Blunt trauma to chest, initial encounter    ED Assessment/Plan    History   Chief Complaint  Patient presents with  . Fall   HPI  2100 hrs. Patient 58 year old gentleman fell out of apparently approximately 6 to 8 feet struck his head left face also right chest by exam regular cooperative he does not have history kidney disease cardiopulmonary general neuroexam negative initially triage trauma CTs ordered stat without delay or labs plan hopefully conservative management suspected rib fracture closed head injury  Past Medical History[1]  Past Surgical History[2]  Family History[3]  Social History[4]  Review of Systems  Cardiovascular:  Positive for chest pain.  Gastrointestinal:  Negative for abdominal pain.  Neurological:  Positive for headaches.  All other systems reviewed and are negative.   Physical Exam   BP 136/86   Pulse 66   Temp 36.7 C (98 F)   Resp 15   Ht 180.3 cm (5' 11)   Wt 88.5 kg (195 lb)   SpO2 99%   BMI 27.20 kg/m   Physical Exam  Vital signs have been reviewed. Patient is well-appearing w/o respiratory distress shock or major trauma. HEENT is atraumatic.  Neck shows unimpaired range of motion. Chest No increased work of breathing or audible wheezing. Cardiac Good perfusion throughout.  Blunt trauma to the right chest Abdomen is not distended. Extremities are without significant trauma. Skin no visualized rash. Neuro exam is grossly non-focal. Psych exam shows normal mood and behavior.  ED Course    Patient vomited at home consistent with a concussion and blunt injury   Medical Decision Making Amount and/or Complexity of Data Reviewed Labs: ordered. Radiology: ordered. ECG/medicine tests: ordered.  Risk Prescription drug management.  Triage old records reviewed.   History of hypertension diabetes lumbar spinal stenosis no anticoagulation  2300 hrs. Preliminary CAT scanning is essentially negative at this time plan for outpatient treatment referral    Patient continues to rest comfortably essentially negative workup at this time except for the final report on the chest and abdomen CAT scan as long as this is negative we will plan outpatient treatment referral close follow-up    Dallara, Norleen Agent, MD 05/25/24 2119    [place] Norleen Agent, MD 05/25/24 2120    Dallara, John James, MD 05/25/24 2257       [1] No past medical history on file. [2] No past surgical history on file. [3] No family history on file. [4] Social History Socioeconomic History  . Marital status: Single   Social Drivers of Corporate investment banker Strain: Low Risk  (05/22/2024)   Received from [facility]   Overall Financial Resource Strain (CARDIA)   . How hard is it for you to pay for the very basics like food, housing, medical care, and heating?: Not hard at all  Food Insecurity: No Food Insecurity (05/22/2024)   [facility]   Hunger Vital Sign   . Within the past 12 months, you worried that your food would run out before you got the money to buy more.: Never true   . Within the past 12 months, the food you bought just didn't last and you didn't have money to get more.: Never true  Transportation Needs: No Transportation Needs (05/22/2024)   Received from [facility] - Transportation   .  In the past 12 months, has lack of transportation kept you from medical appointments or from getting medications?: No   . In the past 12 months, has lack of transportation kept you from meetings, work, or from getting things needed for daily living?: No  Physical Activity: Inactive (05/22/2024)   Received from [facility]   Exercise Vital Sign   . On average, how many days per week do you engage in moderate to strenuous exercise (like a brisk  walk)?: 0 days  Stress: No Stress Concern Present (05/22/2024)   Received from [facility] - Occupational Stress Questionnaire   . Do you feel stress - tense, restless, nervous, or anxious, or unable to sleep at night because your mind is troubled all the time - these days?: Not at all  Social Connections: Unknown (05/22/2024)   Received from [facility]   Social Connection and Isolation Panel   . In a typical week, how many times do you talk on the phone with family, friends, or neighbors?: More than three times a week   . How often do you get together with friends or relatives?: Once a week   . Do you belong to any clubs or organizations such as church groups, unions, fraternal or athletic groups, or school groups?: Yes   . How often do you attend meetings of the clubs or organizations you belong to?: More than 4 times per year   . Are you married, widowed, divorced, separated, never married, or living with a partner?: Never married  Housing: Low Risk  (12/12/2023)   Received from [facility] Stability Vital Sign   . In the last 12 months, was there a time when you were not able to pay the mortgage or rent on time?: No   . In the past 12 months, how many times have you moved where you were living?: 0   . At any time in the past 12 months, were you homeless or living in a shelter (including now)?: No   Dallara, Norleen Agent, MD 05/25/24 2304

## 2024-05-26 ENCOUNTER — Encounter: Payer: Self-pay | Admitting: Internal Medicine

## 2024-05-27 ENCOUNTER — Telehealth (INDEPENDENT_AMBULATORY_CARE_PROVIDER_SITE_OTHER): Admitting: Internal Medicine

## 2024-05-27 ENCOUNTER — Encounter: Payer: Self-pay | Admitting: Internal Medicine

## 2024-05-27 ENCOUNTER — Ambulatory Visit (INDEPENDENT_AMBULATORY_CARE_PROVIDER_SITE_OTHER)
Admission: RE | Admit: 2024-05-27 | Discharge: 2024-05-27 | Disposition: A | Discharge status: Home / Self Care | Source: Ambulatory Visit | Attending: Internal Medicine | Admitting: Internal Medicine

## 2024-05-27 VITALS — Wt 195.0 lb

## 2024-05-27 DIAGNOSIS — M25432 Effusion, left wrist: Secondary | ICD-10-CM

## 2024-05-27 DIAGNOSIS — W19XXXD Unspecified fall, subsequent encounter: Secondary | ICD-10-CM

## 2024-05-27 DIAGNOSIS — M25532 Pain in left wrist: Secondary | ICD-10-CM | POA: Diagnosis not present

## 2024-05-27 DIAGNOSIS — Z09 Encounter for follow-up examination after completed treatment for conditions other than malignant neoplasm: Secondary | ICD-10-CM

## 2024-05-27 NOTE — Progress Notes (Signed)
 Per patient no change in vitals since last visit, unable to obtain new vitals due to telehealth visit.

## 2024-05-27 NOTE — Progress Notes (Signed)
 Virtual Visit via Video Note  I connected with Kenneth Johnson on 05/27/24 at  1:30 PM EDT by a video enabled telemedicine application and verified that I am speaking with the correct person using two identifiers.  Location patient: home Location provider: work office Persons participating in the virtual visit: patient, provider  I discussed the limitations of evaluation and management by telemedicine and the availability of in person appointments. The patient expressed understanding and agreed to proceed.   HPI: Here for ED follow-up.  On Monday he fell off a 6 to 8 foot ladder while trying to pull pears off a tree.  He feels like he might have lost consciousness.  He went to an outside hospital and I have reviewed these records.  All workup was negative including CT maxillofacial, head, chest abdomen and pelvis.  He continues to complain of diffuse muscle aches including of his torso and upper right arm.  Chest wall pain is especially predominant when coughing or sneezing.  He is also concerned about a left wrist and hand swelling and pain that was not x-rayed in the emergency department.   ROS: Negative unless indicated in HPI.  Past Medical History:  Diagnosis Date   BPH (benign prostatic hyperplasia)    DM (diabetes mellitus) (HCC)    Hyperlipidemia associated with type 2 diabetes mellitus (HCC)    Hypertension    Sleep apnea     Past Surgical History:  Procedure Laterality Date   BACK SURGERY     L3-4 decompression   gum implants     ROTATOR CUFF REPAIR Right     Family History  Problem Relation Age of Onset   Hypertension Mother    Colon polyps Father    Hypertension Father    Diabetes Sister    Colon polyps Brother     SOCIAL HX:   reports that he has never smoked. He has never used smokeless tobacco. He reports that he does not drink alcohol  and does not use drugs.   Current Outpatient Medications:    acyclovir (ZOVIRAX) 400 MG tablet, Take 400 mg by  mouth daily., Disp: , Rfl:    aspirin EC 81 MG tablet, Take 81 mg by mouth daily., Disp: , Rfl:    cetirizine (ZYRTEC) 10 MG tablet, Take 10 mg by mouth daily., Disp: , Rfl:    finasteride (PROSCAR) 5 MG tablet, Take 5 mg by mouth daily., Disp: , Rfl:    JARDIANCE 25 MG TABS tablet, Take 25 mg by mouth daily., Disp: , Rfl:    lisinopril (PRINIVIL,ZESTRIL) 10 MG tablet, Take 10 mg by mouth daily., Disp: , Rfl:    Magnesium  Gluconate 250 MG TABS, Take 1 tablet (250 mg total) by mouth at bedtime., Disp: , Rfl:    metFORMIN (GLUCOPHAGE) 1000 MG tablet, Take 1,000 mg by mouth 2 (two) times daily with a meal., Disp: , Rfl:    rosuvastatin (CRESTOR) 5 MG tablet, Take 5 mg by mouth daily., Disp: , Rfl:    tamsulosin (FLOMAX) 0.4 MG CAPS capsule, Take 0.4 mg by mouth daily after breakfast., Disp: , Rfl:    tirzepatide (MOUNJARO) 2.5 MG/0.5ML Pen, Inject 2.5 mg into the skin once a week., Disp: 2 mL, Rfl: 0  EXAM:   VITALS per patient if applicable: None reported  GENERAL: alert, oriented, appears well and in no acute distress  HEENT: atraumatic, conjunttiva clear, no obvious abnormalities on inspection of external nose and ears  NECK: normal movements of the head  and neck  LUNGS: on inspection no signs of respiratory distress, breathing rate appears normal, no obvious gross increased work of breathing, gasping or wheezing  CV: no obvious cyanosis  MS: moves all visible extremities without noticeable abnormality  PSYCH/NEURO: pleasant and cooperative, no obvious depression or anxiety, speech and thought processing grossly intact  ASSESSMENT AND PLAN:   Hospital discharge follow-up  Fall, subsequent encounter - Plan: DG Hand Complete Left, DG Forearm Left  Pain and swelling of left wrist - Plan: DG Hand Complete Left, DG Forearm Left  -Hospital charts have been reviewed in great detail. - Given negative CT scans I believe that his chest wall pain and right arm pain is likely related to  her deep muscle bruising.  However I remain concerned about his left wrist and hand swelling since this was not x-rayed.  I will order an x-ray and he will go get this taken care of today.  He is rotating Tylenol and ibuprofen with some relief.   I discussed the assessment and treatment plan with the patient. The patient was provided an opportunity to ask questions and all were answered. The patient agreed with the plan and demonstrated an understanding of the instructions.   The patient was advised to call back or seek an in-person evaluation if the symptoms worsen or if the condition fails to improve as anticipated.    Tully Theophilus Andrews, MD  [facility] at [facility]

## 2024-06-01 ENCOUNTER — Ambulatory Visit: Payer: Self-pay

## 2024-06-01 NOTE — Telephone Encounter (Signed)
 FYI Only or Action Required?: Action required by provider: lab or test result follow-up needed. Pt wanting to know results of left wrist xray obtained on 10/8.  Patient was last seen in primary care on 05/27/2024 by Theophilus Andrews, Tully GRADE, MD.  Called Nurse Triage reporting Wrist Pain.  Symptoms began several days ago.  Interventions attempted: OTC medications: Tylenol, ibuprofen and Rest, hydration, or home remedies.  Symptoms are: gradually improving.  Triage Disposition: Call PCP Within 24 Hours (overriding See Physician Within 24 Hours)  Patient/caregiver understands and will follow disposition?: Yes Copied from CRM [id number]. Topic: Clinical - Red Word Triage >> Jun 01, 2024  2:16 PM Suzen RAMAN wrote: Red Word that prompted transfer to Nurse Triage: swollen/pain in wrist, seeking recommendation. Has used cold compress with no relief Reason for Disposition  Can't move injured wrist normally (bend or straighten completely)  Answer Assessment - Initial Assessment Questions 1. MECHANISM: How did the injury happen?     Clemens out of tree and broke fall with left wrist  2. ONSET: When did the injury happen? (e.g., minutes or hours ago)      10/6, went to the ER on 10/6. Received scans of head and chest per pt, did not receive wrist x-ray at that time. 10/8 had f/u with PCP regarding left wrist pain. X-ray of left wrist ordered/performed same day at local imaging center.  3. APPEARANCE of INJURY: What does the injury look like?      Swelling of wrist and back of hand, no redness or bruising. Swelling subsiding. Started out halfway up arm, now has subsided to right above wrist.  4. SEVERITY: Can you use your wrist normally? Can you move your wrist back and forth? Can you hold something in your hand?     Difficulty gripping things. Limited back and forth motion.  5. SIZE: For cuts, bruises, or swelling, ask: How large is it? (e.g., inches or centimeters; entire wrist)       No cuts or bruises, moderate swelling.  6. PAIN: How bad is the pain? (Scale 0-10; or none, mild, moderate, severe)     7/10 pain, worse when trying to use hand/wrist.  7. TETANUS: For any breaks in the skin, ask: When was your last tetanus booster?     N/a, no cuts.  8. OTHER SYMPTOMS: Do you have any other symptoms?      No. Denies numbness or tingling.  Protocols used: Wrist Injury-A-AH

## 2024-06-02 ENCOUNTER — Ambulatory Visit: Payer: Self-pay | Admitting: Internal Medicine

## 2024-06-02 DIAGNOSIS — S62102A Fracture of unspecified carpal bone, left wrist, initial encounter for closed fracture: Secondary | ICD-10-CM

## 2024-06-02 DIAGNOSIS — M25532 Pain in left wrist: Secondary | ICD-10-CM

## 2024-06-02 NOTE — Telephone Encounter (Signed)
 Spoke with patient regarding results/recommendations.

## 2024-06-02 NOTE — Telephone Encounter (Signed)
 Copied from CRM [id number]. Topic: Clinical - Lab/Test Results >> Jun 01, 2024  2:15 PM Kenneth Johnson wrote: Reason for CRM:  Patient would like a call back pertaining to x-ray results from 05/27/24.  CB#[phone]

## 2024-06-02 NOTE — Telephone Encounter (Signed)
 Pt states FMLA should have sent forms to be completed yesterday

## 2024-06-04 NOTE — Telephone Encounter (Signed)
 Mr Myhand wants to continue to use CPAP , best pressure is 11 cm water,  I ordered a new machine and new supplies for him.    I am not sure if he is due for a new machine,:   He now restarted using PAP , he had the CPAP ordered through PCP in the past, then lost the DME supplies due to poorer compliance on CPAP.   Can you verify DME and age of machine, please? DME order for settings has been entered ,   Dedra Gores, MD

## 2024-06-08 NOTE — Telephone Encounter (Signed)
 RE: new autopap machine Received: Today New, Adine Neysa Nena GORMAN, RN; Joylene Carlean Sheree Leveda Jackson Easter Delsa Eleanor; 1 other Received, thank you!     Previous Messages    ----- Message ----- From: Neysa Nena GORMAN, RN Sent: 06/08/2024   9:20 AM EDT To: Adine Joylene; Avelina Jackson; Ephraim Dollar* Subject: new autopap machine                            Good morning new order for machine for this pt.  Husein Guedes Male, 58 y.o., 10-30-1965 MRN: [id number] Phone: [phone]   Thanks Particia RN

## 2024-06-08 NOTE — Telephone Encounter (Signed)
 Looking on [facility].  Set up date 08-24-2013.  Added to [employer] 06-14-2022.

## 2024-06-10 ENCOUNTER — Encounter: Payer: Self-pay | Admitting: Internal Medicine

## 2024-06-11 ENCOUNTER — Other Ambulatory Visit: Payer: Self-pay | Admitting: Internal Medicine

## 2024-06-11 DIAGNOSIS — E1169 Type 2 diabetes mellitus with other specified complication: Secondary | ICD-10-CM

## 2024-06-11 MED ORDER — TIRZEPATIDE 5 MG/0.5ML ~~LOC~~ SOAJ: 5.0000 mg | SUBCUTANEOUS | 0 refills | Status: DC

## 2024-07-07 ENCOUNTER — Encounter: Payer: Self-pay | Admitting: Internal Medicine

## 2024-07-08 ENCOUNTER — Encounter: Payer: Self-pay | Admitting: Neurology

## 2024-07-08 MED ORDER — TAMSULOSIN HCL 0.4 MG PO CAPS: 0.4000 mg | ORAL_CAPSULE | Freq: Every day | ORAL | 1 refills | Status: AC

## 2024-07-08 MED ORDER — ACYCLOVIR 400 MG PO TABS: 400.0000 mg | ORAL_TABLET | Freq: Every day | ORAL | 1 refills | Status: AC

## 2024-07-08 MED ORDER — LISINOPRIL 10 MG PO TABS: 10.0000 mg | ORAL_TABLET | Freq: Every day | ORAL | 1 refills | Status: AC

## 2024-07-08 MED ORDER — METFORMIN HCL 1000 MG PO TABS: 1000.0000 mg | ORAL_TABLET | Freq: Two times a day (BID) | ORAL | 1 refills | Status: DC

## 2024-07-08 MED ORDER — ROSUVASTATIN CALCIUM 5 MG PO TABS: 5.0000 mg | ORAL_TABLET | Freq: Every day | ORAL | 1 refills | Status: AC

## 2024-07-08 MED ORDER — JARDIANCE 25 MG PO TABS: 25.0000 mg | ORAL_TABLET | Freq: Every day | ORAL | 1 refills | Status: AC

## 2024-07-08 NOTE — Telephone Encounter (Signed)
 Sent message to [employer] to check on this.

## 2024-07-13 ENCOUNTER — Other Ambulatory Visit: Payer: Self-pay | Admitting: Internal Medicine

## 2024-07-13 DIAGNOSIS — E1169 Type 2 diabetes mellitus with other specified complication: Secondary | ICD-10-CM

## 2024-07-13 MED ORDER — MOUNJARO 7.5 MG/0.5ML ~~LOC~~ SOAJ: 7.5000 mg | SUBCUTANEOUS | 0 refills | Status: DC

## 2024-07-21 ENCOUNTER — Ambulatory Visit: Admitting: Internal Medicine

## 2024-07-21 VITALS — BP 110/80 | HR 65 | Temp 98.4°F | Wt 184.2 lb

## 2024-07-21 DIAGNOSIS — J069 Acute upper respiratory infection, unspecified: Secondary | ICD-10-CM

## 2024-07-21 LAB — POC COVID19 BINAXNOW: SARS Coronavirus 2 Ag: NEGATIVE

## 2024-07-21 LAB — POCT INFLUENZA A/B
Influenza A, POC: NEGATIVE
Influenza B, POC: NEGATIVE

## 2024-07-21 NOTE — Progress Notes (Signed)
 Established Patient Office Visit     CC/Reason for Visit: URI symptoms  HPI: Kenneth Johnson is a 58 y.o. male who is coming in today for the above mentioned reasons.  For the past 3 days has been experiencing congestion, rhinorrhea, postnasal drip, cough, myalgias.  No fever.  No sick contacts that he is aware of but he did get together over the Thanksgiving holiday.   Past Medical/Surgical History: Past Medical History:  Diagnosis Date   BPH (benign prostatic hyperplasia)    DM (diabetes mellitus) (HCC)    Hyperlipidemia associated with type 2 diabetes mellitus (HCC)    Hypertension    Sleep apnea     Past Surgical History:  Procedure Laterality Date   BACK SURGERY     L3-4 decompression   gum implants     ROTATOR CUFF REPAIR Right     Social History:  reports that he has never smoked. He has never used smokeless tobacco. He reports that he does not drink alcohol  and does not use drugs.  Allergies: No Known Allergies  Family History:  Family History  Problem Relation Age of Onset   Hypertension Mother    Colon polyps Father    Hypertension Father    Diabetes Sister    Colon polyps Brother      Current Outpatient Medications:    acyclovir  (ZOVIRAX ) 400 MG tablet, Take 1 tablet (400 mg total) by mouth daily., Disp: 90 tablet, Rfl: 1   aspirin EC 81 MG tablet, Take 81 mg by mouth daily., Disp: , Rfl:    cetirizine (ZYRTEC) 10 MG tablet, Take 10 mg by mouth daily., Disp: , Rfl:    finasteride (PROSCAR) 5 MG tablet, Take 5 mg by mouth daily., Disp: , Rfl:    JARDIANCE  25 MG TABS tablet, Take 1 tablet (25 mg total) by mouth daily., Disp: 90 tablet, Rfl: 1   lisinopril  (ZESTRIL ) 10 MG tablet, Take 1 tablet (10 mg total) by mouth daily., Disp: 90 tablet, Rfl: 1   Magnesium  Gluconate 250 MG TABS, Take 1 tablet (250 mg total) by mouth at bedtime., Disp: , Rfl:    metFORMIN  (GLUCOPHAGE ) 1000 MG tablet, Take 1 tablet (1,000 mg total) by mouth 2 (two) times daily  with a meal., Disp: 180 tablet, Rfl: 1   rosuvastatin  (CRESTOR ) 5 MG tablet, Take 1 tablet (5 mg total) by mouth daily., Disp: 90 tablet, Rfl: 1   tamsulosin  (FLOMAX ) 0.4 MG CAPS capsule, Take 1 capsule (0.4 mg total) by mouth daily after breakfast., Disp: 90 capsule, Rfl: 1   tirzepatide  (MOUNJARO ) 7.5 MG/0.5ML Pen, Inject 7.5 mg into the skin once a week., Disp: 2 mL, Rfl: 0  Review of Systems:  Negative unless indicated in HPI.   Physical Exam: Vitals:   07/21/24 1416  BP: 110/80  Pulse: 65  Temp: 98.4 F (36.9 C)  TempSrc: Oral  SpO2: 98%  Weight: 184 lb 3.2 oz (83.6 kg)    Body mass index is 25.69 kg/m.   Physical Exam Vitals reviewed.  Constitutional:      Appearance: Normal appearance.  HENT:     Right Ear: Tympanic membrane, ear canal and external ear normal.     Left Ear: Tympanic membrane, ear canal and external ear normal.     Mouth/Throat:     Mouth: Mucous membranes are moist.     Pharynx: Posterior oropharyngeal erythema present.  Eyes:     Conjunctiva/sclera: Conjunctivae normal.  Cardiovascular:     Rate  and Rhythm: Normal rate and regular rhythm.  Pulmonary:     Effort: Pulmonary effort is normal.     Breath sounds: Normal breath sounds.  Neurological:     Mental Status: He is alert.      Impression and Plan:  URI with cough and congestion -     POCT Influenza A/B -     POC COVID-19 BinaxNow   - In office flu and COVID tests are negative. -Given exam findings, PNA, pharyngitis, ear infection are not likely, hence abx have not been prescribed. -Have advised rest, fluids, OTC antihistamines, cough suppressants and mucinex. -RTC if no improvement in 10-14 days.   Time spent:22 minutes reviewing chart, interviewing and examining patient and formulating plan of care.     Tully Theophilus Andrews, MD [facility]

## 2024-08-12 ENCOUNTER — Other Ambulatory Visit: Payer: Self-pay | Admitting: *Deleted

## 2024-08-12 DIAGNOSIS — E1169 Type 2 diabetes mellitus with other specified complication: Secondary | ICD-10-CM

## 2024-08-12 MED ORDER — MOUNJARO 7.5 MG/0.5ML ~~LOC~~ SOAJ: 7.5000 mg | SUBCUTANEOUS | 0 refills | Status: DC

## 2024-08-12 NOTE — Telephone Encounter (Signed)
 Refill sent

## 2024-08-25 ENCOUNTER — Encounter: Payer: Self-pay | Admitting: Internal Medicine

## 2024-08-25 ENCOUNTER — Ambulatory Visit (INDEPENDENT_AMBULATORY_CARE_PROVIDER_SITE_OTHER): Admitting: Internal Medicine

## 2024-08-25 VITALS — BP 120/78 | HR 77 | Temp 98.3°F | Wt 176.6 lb

## 2024-08-25 DIAGNOSIS — E1169 Type 2 diabetes mellitus with other specified complication: Secondary | ICD-10-CM

## 2024-08-25 DIAGNOSIS — Z7984 Long term (current) use of oral hypoglycemic drugs: Secondary | ICD-10-CM | POA: Diagnosis not present

## 2024-08-25 DIAGNOSIS — G4733 Obstructive sleep apnea (adult) (pediatric): Secondary | ICD-10-CM | POA: Diagnosis not present

## 2024-08-25 DIAGNOSIS — I10 Essential (primary) hypertension: Secondary | ICD-10-CM

## 2024-08-25 DIAGNOSIS — E785 Hyperlipidemia, unspecified: Secondary | ICD-10-CM | POA: Diagnosis not present

## 2024-08-25 LAB — POCT GLYCOSYLATED HEMOGLOBIN (HGB A1C): Hemoglobin A1C: 6.4 % — AB (ref 4.0–5.6)

## 2024-08-25 MED ORDER — METFORMIN HCL 1000 MG PO TABS: 500.0000 mg | ORAL_TABLET | Freq: Every day | ORAL | 0 refills | Status: AC

## 2024-08-25 NOTE — Assessment & Plan Note (Signed)
 Improve control.  Continue Jardiance , Mounjaro .  Decrease metformin  to 500 mg daily.

## 2024-08-25 NOTE — Assessment & Plan Note (Signed)
 Most recent lipid panel with a total cholesterol of 118, triglycerides 51, HDL 48 and LDL 59.  On rosuvastatin 5 mg daily.

## 2024-08-25 NOTE — Assessment & Plan Note (Signed)
On CPAP therapy  

## 2024-08-25 NOTE — Progress Notes (Signed)
 "    Established Patient Office Visit     CC/Reason for Visit: Follow-up chronic conditions  HPI: Kenneth Johnson is a 59 y.o. male who is coming in today for the above mentioned reasons. Past Medical History is significant for: Hypertension, hyperlipidemia, type 2 diabetes, BPH, OSA.  Feeling well.  No acute concerns or complaints.  Has been tolerating Mounjaro  well.   Past Medical/Surgical History: Past Medical History:  Diagnosis Date   BPH (benign prostatic hyperplasia)    DM (diabetes mellitus) (HCC)    Hyperlipidemia associated with type 2 diabetes mellitus (HCC)    Hypertension    Sleep apnea     Past Surgical History:  Procedure Laterality Date   BACK SURGERY     L3-4 decompression   gum implants     ROTATOR CUFF REPAIR Right     Social History:  reports that he has never smoked. He has never used smokeless tobacco. He reports that he does not drink alcohol  and does not use drugs.  Allergies: Allergies[1]  Family History:  Family History  Problem Relation Age of Onset   Hypertension Mother    Colon polyps Father    Hypertension Father    Diabetes Sister    Colon polyps Brother     Current Medications[2]  Review of Systems:  Negative unless indicated in HPI.   Physical Exam: Vitals:   08/25/24 0907 08/25/24 0929  BP: 120/84 120/78  Pulse: 77   Temp: 98.3 F (36.8 C)   TempSrc: Oral   SpO2: 99%   Weight: 176 lb 9.6 oz (80.1 kg)     Body mass index is 24.63 kg/m.   Physical Exam Vitals reviewed.  Constitutional:      Appearance: Normal appearance.  HENT:     Head: Normocephalic and atraumatic.  Eyes:     Conjunctiva/sclera: Conjunctivae normal.  Cardiovascular:     Rate and Rhythm: Normal rate and regular rhythm.  Pulmonary:     Effort: Pulmonary effort is normal.     Breath sounds: Normal breath sounds.  Skin:    General: Skin is warm and dry.  Neurological:     General: No focal deficit present.     Mental Status: He is  alert and oriented to person, place, and time.  Psychiatric:        Mood and Affect: Mood normal.        Behavior: Behavior normal.        Thought Content: Thought content normal.        Judgment: Judgment normal.      Impression and Plan:  Type 2 diabetes mellitus with other specified complication, without long-term current use of insulin (HCC) Assessment & Plan: Improve control.  Continue Jardiance , Mounjaro .  Decrease metformin  to 500 mg daily.  Orders: -     POCT glycosylated hemoglobin (Hb A1C) -     metFORMIN  HCl; Take 0.5 tablets (500 mg total) by mouth daily with breakfast.  Dispense: 90 tablet; Refill: 0  Hyperlipidemia associated with type 2 diabetes mellitus (HCC) Assessment & Plan: Most recent lipid panel with a total cholesterol of 118, triglycerides 51, HDL 48 and LDL 59.  On rosuvastatin  5 mg daily.   Primary hypertension Assessment & Plan: Well-controlled on current.   OSA on CPAP Assessment & Plan: On CPAP therapy.      Time spent:32 minutes reviewing chart, interviewing and examining patient and formulating plan of care.     Tully Theophilus Andrews, MD [facility]  [facility]     [1] No Known Allergies [2]  Current Outpatient Medications:    acyclovir  (ZOVIRAX ) 400 MG tablet, Take 1 tablet (400 mg total) by mouth daily., Disp: 90 tablet, Rfl: 1   aspirin EC 81 MG tablet, Take 81 mg by mouth daily., Disp: , Rfl:    cetirizine (ZYRTEC) 10 MG tablet, Take 10 mg by mouth daily., Disp: , Rfl:    finasteride (PROSCAR) 5 MG tablet, Take 5 mg by mouth daily., Disp: , Rfl:    JARDIANCE  25 MG TABS tablet, Take 1 tablet (25 mg total) by mouth daily., Disp: 90 tablet, Rfl: 1   lisinopril  (ZESTRIL ) 10 MG tablet, Take 1 tablet (10 mg total) by mouth daily., Disp: 90 tablet, Rfl: 1   Magnesium  Gluconate 250 MG TABS, Take 1 tablet (250 mg total) by mouth at bedtime., Disp: , Rfl:    rosuvastatin  (CRESTOR ) 5 MG tablet, Take 1 tablet (5 mg total) by  mouth daily., Disp: 90 tablet, Rfl: 1   tamsulosin  (FLOMAX ) 0.4 MG CAPS capsule, Take 1 capsule (0.4 mg total) by mouth daily after breakfast., Disp: 90 capsule, Rfl: 1   tirzepatide  (MOUNJARO ) 7.5 MG/0.5ML Pen, Inject 7.5 mg into the skin once a week., Disp: 2 mL, Rfl: 0   metFORMIN  (GLUCOPHAGE ) 1000 MG tablet, Take 0.5 tablets (500 mg total) by mouth daily with breakfast., Disp: 90 tablet, Rfl: 0  "

## 2024-08-25 NOTE — Assessment & Plan Note (Signed)
 Well-controlled on current.

## 2024-09-10 ENCOUNTER — Encounter: Payer: Self-pay | Admitting: Internal Medicine

## 2024-09-14 NOTE — Telephone Encounter (Unsigned)
 Copied from CRM #[id number]. Topic: Clinical - Medication Question >> Sep 14, 2024 10:55 AM Terri MATSU wrote: Reason for CRM: Patient needs a new prescription for tirzepatide  (MOUNJARO ) 7.5 MG/0.5ML Pen. He tooks his last shot on last Monday.

## 2024-09-15 ENCOUNTER — Other Ambulatory Visit: Payer: Self-pay | Admitting: Internal Medicine

## 2024-09-15 DIAGNOSIS — E1169 Type 2 diabetes mellitus with other specified complication: Secondary | ICD-10-CM

## 2024-09-15 MED ORDER — MOUNJARO 10 MG/0.5ML ~~LOC~~ SOAJ: 10.0000 mg | SUBCUTANEOUS | 0 refills | Status: AC

## 2024-12-07 ENCOUNTER — Encounter: Admitting: Internal Medicine
